# Patient Record
Sex: Female | Born: 1935 | Race: Black or African American | Hispanic: No | Smoking: Never smoker
Health system: Southern US, Community
[De-identification: ages and names within clinical notes are randomized; demographics above are authoritative.]

## PROBLEM LIST (undated history)

## (undated) DIAGNOSIS — I10 Essential (primary) hypertension: Secondary | ICD-10-CM

## (undated) DIAGNOSIS — E119 Type 2 diabetes mellitus without complications: Secondary | ICD-10-CM

---

## 2013-08-04 ENCOUNTER — Encounter (HOSPITAL_COMMUNITY): Payer: Self-pay | Admitting: Emergency Medicine

## 2013-08-04 ENCOUNTER — Emergency Department (HOSPITAL_COMMUNITY): Payer: Medicare (Managed Care)

## 2013-08-04 ENCOUNTER — Inpatient Hospital Stay (HOSPITAL_COMMUNITY)
Admission: EM | Admit: 2013-08-04 | Discharge: 2013-08-15 | DRG: 336 | Disposition: A | Discharge status: Home / Self Care | Payer: Medicare (Managed Care) | Attending: Surgery | Admitting: Surgery

## 2013-08-04 DIAGNOSIS — R32 Unspecified urinary incontinence: Secondary | ICD-10-CM | POA: Diagnosis not present

## 2013-08-04 DIAGNOSIS — K560 Paralytic ileus: Secondary | ICD-10-CM | POA: Diagnosis not present

## 2013-08-04 DIAGNOSIS — Y9210 Unspecified residential institution as the place of occurrence of the external cause: Secondary | ICD-10-CM | POA: Diagnosis not present

## 2013-08-04 DIAGNOSIS — K565 Intestinal adhesions [bands], unspecified as to partial versus complete obstruction: Principal | ICD-10-CM | POA: Diagnosis present

## 2013-08-04 DIAGNOSIS — K929 Disease of digestive system, unspecified: Secondary | ICD-10-CM | POA: Diagnosis not present

## 2013-08-04 DIAGNOSIS — R229 Localized swelling, mass and lump, unspecified: Secondary | ICD-10-CM

## 2013-08-04 DIAGNOSIS — Z9889 Other specified postprocedural states: Secondary | ICD-10-CM

## 2013-08-04 DIAGNOSIS — IMO0002 Reserved for concepts with insufficient information to code with codable children: Secondary | ICD-10-CM

## 2013-08-04 DIAGNOSIS — Y838 Other surgical procedures as the cause of abnormal reaction of the patient, or of later complication, without mention of misadventure at the time of the procedure: Secondary | ICD-10-CM | POA: Diagnosis not present

## 2013-08-04 DIAGNOSIS — Z79899 Other long term (current) drug therapy: Secondary | ICD-10-CM

## 2013-08-04 DIAGNOSIS — E119 Type 2 diabetes mellitus without complications: Secondary | ICD-10-CM | POA: Diagnosis present

## 2013-08-04 DIAGNOSIS — R112 Nausea with vomiting, unspecified: Secondary | ICD-10-CM | POA: Diagnosis not present

## 2013-08-04 DIAGNOSIS — I10 Essential (primary) hypertension: Secondary | ICD-10-CM | POA: Diagnosis present

## 2013-08-04 DIAGNOSIS — M6289 Other specified disorders of muscle: Secondary | ICD-10-CM | POA: Diagnosis present

## 2013-08-04 DIAGNOSIS — K56609 Unspecified intestinal obstruction, unspecified as to partial versus complete obstruction: Secondary | ICD-10-CM

## 2013-08-04 DIAGNOSIS — K8020 Calculus of gallbladder without cholecystitis without obstruction: Secondary | ICD-10-CM

## 2013-08-04 HISTORY — DX: Essential (primary) hypertension: I10

## 2013-08-04 HISTORY — DX: Type 2 diabetes mellitus without complications: E11.9

## 2013-08-04 LAB — COMPREHENSIVE METABOLIC PANEL
ALT: 64 U/L — ABNORMAL HIGH (ref 0–35)
AST: 80 U/L — ABNORMAL HIGH (ref 0–37)
Albumin: 3.9 g/dL (ref 3.5–5.2)
Alkaline Phosphatase: 76 U/L (ref 39–117)
BUN: 26 mg/dL — ABNORMAL HIGH (ref 6–23)
CO2: 26 mEq/L (ref 19–32)
Calcium: 10.6 mg/dL — ABNORMAL HIGH (ref 8.4–10.5)
Chloride: 94 mEq/L — ABNORMAL LOW (ref 96–112)
Creatinine, Ser: 0.78 mg/dL (ref 0.50–1.10)
GFR calc Af Amer: >90 mL/min (ref >90)
GFR calc non Af Amer: 79 mL/min — ABNORMAL LOW (ref >90)
Glucose, Bld: 168 mg/dL — ABNORMAL HIGH (ref 70–99)
Potassium: 4 mEq/L (ref 3.7–5.3)
Sodium: 136 mEq/L — ABNORMAL LOW (ref 137–147)
Total Bilirubin: 1.2 mg/dL (ref 0.3–1.2)
Total Protein: 8 g/dL (ref 6.0–8.3)

## 2013-08-04 LAB — CBC WITH DIFFERENTIAL/PLATELET
Basophils Absolute: 0 10*3/uL (ref 0.0–0.1)
Basophils Relative: 0 % (ref 0–1)
Eosinophils Absolute: 0 10*3/uL (ref 0.0–0.7)
Eosinophils Relative: 0 % (ref 0–5)
HCT: 47.2 % — ABNORMAL HIGH (ref 36.0–46.0)
Hemoglobin: 16.1 g/dL — ABNORMAL HIGH (ref 12.0–15.0)
Lymphocytes Relative: 9 % — ABNORMAL LOW (ref 12–46)
Lymphs Abs: 0.7 10*3/uL (ref 0.7–4.0)
MCH: 30.4 pg (ref 26.0–34.0)
MCHC: 34.1 g/dL (ref 30.0–36.0)
MCV: 89.1 fL (ref 78.0–100.0)
Monocytes Absolute: 0.9 10*3/uL (ref 0.1–1.0)
Monocytes Relative: 11 % (ref 3–12)
Neutro Abs: 6.3 10*3/uL (ref 1.7–7.7)
Neutrophils Relative %: 80 % — ABNORMAL HIGH (ref 43–77)
Platelets: 153 10*3/uL (ref 150–400)
RBC: 5.3 MIL/uL — ABNORMAL HIGH (ref 3.87–5.11)
RDW: 13.4 % (ref 11.5–15.5)
WBC: 7.9 10*3/uL (ref 4.0–10.5)

## 2013-08-04 LAB — URINALYSIS, ROUTINE W REFLEX MICROSCOPIC
Bilirubin Urine: NEGATIVE
Glucose, UA: NEGATIVE mg/dL
Hgb urine dipstick: NEGATIVE
Ketones, ur: NEGATIVE mg/dL
Nitrite: NEGATIVE
Protein, ur: NEGATIVE mg/dL
Specific Gravity, Urine: >1.046 — ABNORMAL HIGH (ref 1.005–1.030)
Urobilinogen, UA: 0.2 mg/dL (ref 0.0–1.0)
pH: 5.5 (ref 5.0–8.0)

## 2013-08-04 LAB — LIPASE, BLOOD: Lipase: 16 U/L (ref 11–59)

## 2013-08-04 LAB — URINE MICROSCOPIC-ADD ON

## 2013-08-04 LAB — MAGNESIUM: Magnesium: 2 mg/dL (ref 1.5–2.5)

## 2013-08-04 LAB — GLUCOSE, CAPILLARY
GLUCOSE-CAPILLARY: 138 mg/dL — AB (ref 70–99)
Glucose-Capillary: 132 mg/dL — ABNORMAL HIGH (ref 70–99)

## 2013-08-04 LAB — PHOSPHORUS: PHOSPHORUS: 4.6 mg/dL (ref 2.3–4.6)

## 2013-08-04 MED ORDER — DORZOLAMIDE HCL-TIMOLOL MAL 2-0.5 % OP SOLN
1.0000 [drp] | Freq: Two times a day (BID) | OPHTHALMIC | Status: DC
  Administered 2013-08-04 – 2013-08-08 (×9): 1 [drp] via OPHTHALMIC
  Filled 2013-08-04 (×2): qty 10

## 2013-08-04 MED ORDER — MORPHINE SULFATE 2 MG/ML IJ SOLN
1.0000 mg | INTRAMUSCULAR | Status: DC | PRN
  Administered 2013-08-04 – 2013-08-08 (×3): 1 mg via INTRAVENOUS
  Filled 2013-08-04 (×3): qty 1

## 2013-08-04 MED ORDER — INSULIN ASPART 100 UNIT/ML ~~LOC~~ SOLN
0.0000 [IU] | Freq: Three times a day (TID) | SUBCUTANEOUS | Status: DC
  Administered 2013-08-04 – 2013-08-06 (×7): 1 [IU] via SUBCUTANEOUS
  Administered 2013-08-07 (×2): 2 [IU] via SUBCUTANEOUS
  Administered 2013-08-07 – 2013-08-08 (×2): 1 [IU] via SUBCUTANEOUS

## 2013-08-04 MED ORDER — ONDANSETRON HCL 4 MG/2ML IJ SOLN
4.0000 mg | Freq: Four times a day (QID) | INTRAMUSCULAR | Status: DC | PRN
  Administered 2013-08-05: 4 mg via INTRAVENOUS
  Filled 2013-08-04: qty 2

## 2013-08-04 MED ORDER — DEXTROSE-NACL 5-0.9 % IV SOLN
INTRAVENOUS | Status: DC
  Administered 2013-08-04 – 2013-08-05 (×2): via INTRAVENOUS

## 2013-08-04 MED ORDER — SODIUM CHLORIDE 0.9 % IV SOLN
INTRAVENOUS | Status: DC
  Administered 2013-08-04: 14:00:00 via INTRAVENOUS

## 2013-08-04 MED ORDER — SODIUM CHLORIDE 0.9 % IV BOLUS (SEPSIS)
1000.0000 mL | Freq: Once | INTRAVENOUS | Status: AC
  Administered 2013-08-04: 1000 mL via INTRAVENOUS

## 2013-08-04 MED ORDER — MORPHINE SULFATE 4 MG/ML IJ SOLN
4.0000 mg | Freq: Once | INTRAMUSCULAR | Status: AC
  Administered 2013-08-04: 4 mg via INTRAVENOUS
  Filled 2013-08-04: qty 1

## 2013-08-04 MED ORDER — MENTHOL 3 MG MT LOZG
1.0000 | LOZENGE | OROMUCOSAL | Status: DC | PRN
  Filled 2013-08-04: qty 9

## 2013-08-04 MED ORDER — SODIUM CHLORIDE 0.9 % IV SOLN
INTRAVENOUS | Status: DC
Start: 2013-08-04 — End: 2013-08-04

## 2013-08-04 MED ORDER — IOHEXOL 300 MG/ML  SOLN
100.0000 mL | Freq: Once | INTRAMUSCULAR | Status: AC | PRN
  Administered 2013-08-04: 100 mL via INTRAVENOUS

## 2013-08-04 MED ORDER — IOHEXOL 300 MG/ML  SOLN
50.0000 mL | Freq: Once | INTRAMUSCULAR | Status: AC | PRN
  Administered 2013-08-04: 50 mL via ORAL

## 2013-08-04 MED ORDER — ONDANSETRON HCL 4 MG/2ML IJ SOLN
4.0000 mg | Freq: Once | INTRAMUSCULAR | Status: AC
  Administered 2013-08-04: 4 mg via INTRAVENOUS
  Filled 2013-08-04: qty 2

## 2013-08-04 MED ORDER — LABETALOL HCL 5 MG/ML IV SOLN
5.0000 mg | Freq: Four times a day (QID) | INTRAVENOUS | Status: DC | PRN
  Administered 2013-08-04 – 2013-08-05 (×2): 5 mg via INTRAVENOUS
  Filled 2013-08-04 (×2): qty 4

## 2013-08-04 MED ORDER — HEPARIN SODIUM (PORCINE) 5000 UNIT/ML IJ SOLN
5000.0000 [IU] | Freq: Three times a day (TID) | INTRAMUSCULAR | Status: DC
Start: 2013-08-04 — End: 2013-08-08
  Administered 2013-08-04 – 2013-08-08 (×12): 5000 [IU] via SUBCUTANEOUS
  Filled 2013-08-04 (×15): qty 1

## 2013-08-04 MED ORDER — ONDANSETRON HCL 4 MG PO TABS: 4.0000 mg | ORAL_TABLET | Freq: Four times a day (QID) | ORAL | Status: DC | PRN

## 2013-08-04 MED ORDER — PHENOL 1.4 % MT LIQD
1.0000 | OROMUCOSAL | Status: DC | PRN
  Filled 2013-08-04: qty 177

## 2013-08-04 NOTE — H&P (Signed)
[facility] History and Physical  Bonnie Briggs OZH:[id number] DOB: 01/14/1935 DOA: 08/04/2013  Referring physician: Dr. Juleen China PCP: No primary provider on file.   Chief Complaint: Nausea, emesis, abdominal discomfort  HPI: Bonnie Briggs is a 79 y.o. female with history of diabetes mellitus and hypertension. Patient reports the above complaints for the last 3 days. States that the emesis has been green at times he has been unable to keep any food down. This has also been accompanied with abdominal discomfort. The symptoms since onset 3 days ago after an persistently present. Nothing she is aware of makes it better. Do to the persistence in symptoms patient decided to present for further evaluation and recommendations.  In the emergency department patient had a CT scan which reported small bowel obstruction most likely mid to distal jejunal. Reports the etiology of obstruction is not identified. Patient reports that he had cesarean section delivery many years ago with a vertical incision.  Review of Systems:  Constitutional:  No weight loss, night sweats, Fevers, chills, fatigue.  HEENT:  No headaches, Difficulty swallowing,Tooth/dental problems,Sore throat,  No sneezing, itching, ear ache, nasal congestion, post nasal drip,  Cardio-vascular:  No chest pain, Orthopnea, PND, swelling in lower extremities, anasarca, dizziness, palpitations  GI:  No heartburn, indigestion, + abdominal pain,+ nausea,+ vomiting, diarrhea, change in bowel habits,+ loss of appetite  Resp:  No shortness of breath with exertion or at rest. No excess mucus, no productive cough, No non-productive cough, No coughing up of blood.No change in color of mucus.No wheezing.No chest wall deformity  Skin:  no rash or lesions.  GU:  no dysuria, change in color of urine, no urgency or frequency. No flank pain.  Musculoskeletal:  No joint pain or swelling. No decreased range of motion. No back pain.  Psych:  No change in  mood or affect. No depression or anxiety. No memory loss.   Past Medical History  Diagnosis Date  . Hypertension   . Diabetes mellitus without complication    History reviewed. No pertinent past surgical history. Social History:  reports that she has never smoked. She does not have any smokeless tobacco history on file. She reports that she does not drink alcohol. Her drug history is not on file.  No Known Allergies  No family history on file.   Prior to Admission medications   Medication Sig Start Date End Date Taking? Authorizing Provider  alum & mag hydroxide-simeth (MAALOX/MYLANTA) 200-200-20 MG/5ML suspension Take 30 mLs by mouth every 6 (six) hours as needed for indigestion or heartburn.   Yes Historical Provider, MD  diltiazem (CARDIZEM) 120 MG tablet Take 120 mg by mouth 2 (two) times daily.   Yes Historical Provider, MD  dorzolamide-timolol (COSOPT) 22.3-6.8 MG/ML ophthalmic solution Place 1 drop into both eyes 2 (two) times daily.    Yes Historical Provider, MD  metFORMIN (GLUMETZA) 500 MG (MOD) 24 hr tablet Take 500 mg by mouth every evening.   Yes Historical Provider, MD  promethazine (PHENERGAN) 12.5 MG tablet Take 12.5-25 mg by mouth every 6 (six) hours as needed for nausea or vomiting.   Yes Historical Provider, MD  valsartan (DIOVAN) 160 MG tablet Take 160 mg by mouth 2 (two) times daily.   Yes Historical Provider, MD   Physical Exam: Filed Vitals:   08/04/13 1235  BP: 167/96  Pulse: 99  Temp:   Resp: 16    BP 167/96  Pulse 99  Temp(Src) 97.8 F (36.6 C) (Oral)  Resp 16  SpO2 93%  General:  Appears calm and comfortable Eyes: PERRL, normal lids, irises & conjunctiva ENT: grossly normal hearing, lips & tongue Neck: no LAD, masses or thyromegaly Cardiovascular: RRR, no m/r/g. No LE edema. Telemetry: SR, no arrhythmias  Respiratory: CTA bilaterally, no w/r/r. Normal respiratory effort. Abdomen: soft, distended, hypoactive bowel sounds, no cullens sign, no  rebound tenderness Skin: no rash or induration seen on limited exam Musculoskeletal: grossly normal tone BUE/BLE Psychiatric: grossly normal mood and affect, speech fluent and appropriate Neurologic: grossly non-focal.          Labs on Admission:  Basic Metabolic Panel:  Recent Labs Lab 08/04/13 0853  NA 136*  K 4.0  CL 94*  CO2 26  GLUCOSE 168*  BUN 26*  CREATININE 0.78  CALCIUM 10.6*   Liver Function Tests:  Recent Labs Lab 08/04/13 0853  AST 80*  ALT 64*  ALKPHOS 76  BILITOT 1.2  PROT 8.0  ALBUMIN 3.9    Recent Labs Lab 08/04/13 0853  LIPASE 16   No results found for this basename: AMMONIA,  in the last 168 hours CBC:  Recent Labs Lab 08/04/13 0853  WBC 7.9  NEUTROABS 6.3  HGB 16.1*  HCT 47.2*  MCV 89.1  PLT 153   Cardiac Enzymes: No results found for this basename: CKTOTAL, CKMB, CKMBINDEX, TROPONINI,  in the last 168 hours  BNP (last 3 results) No results found for this basename: PROBNP,  in the last 8760 hours CBG: No results found for this basename: GLUCAP,  in the last 168 hours  Radiological Exams on Admission: Ct Abdomen Pelvis W Contrast  08/04/2013   CLINICAL DATA:  Pain.  Emesis.  EXAM: CT ABDOMEN AND PELVIS WITH CONTRAST  TECHNIQUE: Multidetector CT imaging of the abdomen and pelvis was performed using the standard protocol following bolus administration of intravenous contrast.  CONTRAST:  50mL OMNIPAQUE IOHEXOL 300 MG/ML SOLN, 100mL OMNIPAQUE IOHEXOL 300 MG/ML SOLN  COMPARISON:  None.  FINDINGS: Liver normal. Hepatic veins and portal vein patent. The spleen and splenic vein are normal. Pancreas normal. No biliary distention. The gallbladder is slightly distended. Multiple gallstones are present. No pericholecystic fluid collection or gallbladder wall thickening noted.  Adrenals normal. Tiny simple cysts and parapelvic cysts both kidneys. No hydronephrosis or obstructing calcified ureteral stones noted. Pelvic phleboliths. Bladder  nondistended. No hydronephrosis. Calcified uterine fibroid. Adnexa unremarkable. Small amount of free pelvic fluid.  No significant adenopathy. Abdominal aorta is widely patent with atherosclerotic vascular changes. Visceral vessels are patent.  The appendix normal. Multiple dilated loops of small bowel are noted with distention up to 3.9 cm noted. These findings are consistent with small-bowel obstruction. This appears to be in the mid to distal jejunal region. Etiology of small bowel obstruction not identified. Decompressed loops of distal small bowel noted. Large amount stool is noted within the colon. The colon is nondistended. No free air. No pneumatosis. Again mesenteric vessels are patent. Mesenteric venous structures are patent. No mesenteric mass. No hernia.  Large fat containing lesion noted in the left gluteal musculature. This measures 9.9 x 5.6 cm. Septations are noted within this fat containing density. A small 1.1 cm nodule in the lower aspect of this fatty mass is noted. Although this finding may represent a benign lipoma a more aggressive lesion such as a liposarcoma cannot be excluded. Nonemergent MRI of the pelvis with gadolinium enhancement is suggested. Heart size normal. Moderate right sided pleural effusion noted. Bibasilar atelectasis and/or infiltrates present. Degenerative changes lumbar spine and both hips.  Tiny sclerotic density in the right hip, most likely a benign bone island.  IMPRESSION: 1. Small bowel obstruction, most likely mid to distal jejunal. Etiology of obstruction is not identified. No pneumatosis, free air, or portal venous air. Mesenteric vasculature patent. 2. Multiple gallstones. Gallbladder slightly distended. No gallbladder wall thickening or pericholecystic fluid collections. 3. 9.9 x 5.6 cm fatty mass within the left gluteal musculature. This mass contains septations and a small 1.1 cm nodule in its inferior portion. Although this may represent a benign lipoma, a  more aggressive lesion such as a liposarcoma cannot be excluded. Nonemergent gadolinium-enhanced MRI of the pelvis is suggested for further evaluation. 4. Small amount of free pelvic fluid. 5. Multiple calcified uterine fibroids. 6. Bibasilar atelectasis and/or infiltrates with moderate size right pleural effusion. These results were called by telephone at the time of interpretation on 08/04/2013 at 10:33 AM to Dr. Raeford RazorSTEPHEN KOHUT , who verbally acknowledged these results.   Electronically Signed   By: Maisie Fushomas  Register   On: 08/04/2013 10:35    Assessment/Plan Active Problems:   SBO (small bowel obstruction) - General surgery consulted by emergency department physician. - Defer NG tube recommendations to Gen. Surgery. - Pain control and bowel rest - Will place on maintenance IV fluids    HTN (hypertension) - Place patient on Labetalol IV PRN elevated BP  DM - Patient will be n.p.o. as such will place on sliding scale insulin -CBG to be checked every 4 hours while patient n.p.o.  DVT prophylaxis: Heparin subcutaneous   Code Status: Full Family Communication: No family at bedside Disposition Plan:   Time spent: > 35 minutes  Penny PiaVEGA, Deiona Hooper [facility] Pager [phone]  **Disclaimer: This note may have been dictated with voice recognition software. Similar sounding words can inadvertently be transcribed and this note may contain transcription errors which may not have been corrected upon publication of note.**

## 2013-08-04 NOTE — Consult Note (Signed)
Reason for Consult:abdominal pain Referring Physician: Dr. Roderick Pee is an 79 y.o. female.  HPI: The pt is a 79yo bf who began feeling bad Thursday. She complained of abdominal pain with nausea and vomiting. She denies fevers. It has been a couple days since she has passed flatus. Her only surgery is a c section 40years ago.  Past Medical History  Diagnosis Date  . Hypertension   . Diabetes mellitus without complication     History reviewed. No pertinent past surgical history.  No family history on file.  Social History:  reports that she has never smoked. She does not have any smokeless tobacco history on file. She reports that she does not drink alcohol. Her drug history is not on file.  Allergies: No Known Allergies  Medications: I have reviewed the patient's current medications.  Results for orders placed during the hospital encounter of 08/04/13 (from the past 48 hour(s))  COMPREHENSIVE METABOLIC PANEL     Status: Abnormal   Collection Time    08/04/13  8:53 AM      Result Value Ref Range   Sodium 136 (*) 137 - 147 mEq/L   Potassium 4.0  3.7 - 5.3 mEq/L   Chloride 94 (*) 96 - 112 mEq/L   CO2 26  19 - 32 mEq/L   Glucose, Bld 168 (*) 70 - 99 mg/dL   BUN 26 (*) 6 - 23 mg/dL   Creatinine, Ser 0.78  0.50 - 1.10 mg/dL   Calcium 10.6 (*) 8.4 - 10.5 mg/dL   Total Protein 8.0  6.0 - 8.3 g/dL   Albumin 3.9  3.5 - 5.2 g/dL   AST 80 (*) 0 - 37 U/L   ALT 64 (*) 0 - 35 U/L   Alkaline Phosphatase 76  39 - 117 U/L   Total Bilirubin 1.2  0.3 - 1.2 mg/dL   GFR calc non Af Amer 79 (*) >90 mL/min   GFR calc Af Amer >90  >90 mL/min   Comment: (NOTE)     The eGFR has been calculated using the CKD EPI equation.     This calculation has not been validated in all clinical situations.     eGFR's persistently <90 mL/min signify possible Chronic Kidney     Disease.  CBC WITH DIFFERENTIAL     Status: Abnormal   Collection Time    08/04/13  8:53 AM      Result Value Ref Range    WBC 7.9  4.0 - 10.5 K/uL   RBC 5.30 (*) 3.87 - 5.11 MIL/uL   Hemoglobin 16.1 (*) 12.0 - 15.0 g/dL   HCT 47.2 (*) 36.0 - 46.0 %   MCV 89.1  78.0 - 100.0 fL   MCH 30.4  26.0 - 34.0 pg   MCHC 34.1  30.0 - 36.0 g/dL   RDW 13.4  11.5 - 15.5 %   Platelets 153  150 - 400 K/uL   Neutrophils Relative % 80 (*) 43 - 77 %   Neutro Abs 6.3  1.7 - 7.7 K/uL   Lymphocytes Relative 9 (*) 12 - 46 %   Lymphs Abs 0.7  0.7 - 4.0 K/uL   Monocytes Relative 11  3 - 12 %   Monocytes Absolute 0.9  0.1 - 1.0 K/uL   Eosinophils Relative 0  0 - 5 %   Eosinophils Absolute 0.0  0.0 - 0.7 K/uL   Basophils Relative 0  0 - 1 %   Basophils Absolute 0.0  0.0 - 0.1 K/uL  LIPASE, BLOOD     Status: None   Collection Time    08/04/13  8:53 AM      Result Value Ref Range   Lipase 16  11 - 59 U/L    Ct Abdomen Pelvis W Contrast  08/04/2013   CLINICAL DATA:  Pain.  Emesis.  EXAM: CT ABDOMEN AND PELVIS WITH CONTRAST  TECHNIQUE: Multidetector CT imaging of the abdomen and pelvis was performed using the standard protocol following bolus administration of intravenous contrast.  CONTRAST:  49m OMNIPAQUE IOHEXOL 300 MG/ML SOLN, 1051mOMNIPAQUE IOHEXOL 300 MG/ML SOLN  COMPARISON:  None.  FINDINGS: Liver normal. Hepatic veins and portal vein patent. The spleen and splenic vein are normal. Pancreas normal. No biliary distention. The gallbladder is slightly distended. Multiple gallstones are present. No pericholecystic fluid collection or gallbladder wall thickening noted.  Adrenals normal. Tiny simple cysts and parapelvic cysts both kidneys. No hydronephrosis or obstructing calcified ureteral stones noted. Pelvic phleboliths. Bladder nondistended. No hydronephrosis. Calcified uterine fibroid. Adnexa unremarkable. Small amount of free pelvic fluid.  No significant adenopathy. Abdominal aorta is widely patent with atherosclerotic vascular changes. Visceral vessels are patent.  The appendix normal. Multiple dilated loops of small bowel are  noted with distention up to 3.9 cm noted. These findings are consistent with small-bowel obstruction. This appears to be in the mid to distal jejunal region. Etiology of small bowel obstruction not identified. Decompressed loops of distal small bowel noted. Large amount stool is noted within the colon. The colon is nondistended. No free air. No pneumatosis. Again mesenteric vessels are patent. Mesenteric venous structures are patent. No mesenteric mass. No hernia.  Large fat containing lesion noted in the left gluteal musculature. This measures 9.9 x 5.6 cm. Septations are noted within this fat containing density. A small 1.1 cm nodule in the lower aspect of this fatty mass is noted. Although this finding may represent a benign lipoma a more aggressive lesion such as a liposarcoma cannot be excluded. Nonemergent MRI of the pelvis with gadolinium enhancement is suggested. Heart size normal. Moderate right sided pleural effusion noted. Bibasilar atelectasis and/or infiltrates present. Degenerative changes lumbar spine and both hips. Tiny sclerotic density in the right hip, most likely a benign bone island.  IMPRESSION: 1. Small bowel obstruction, most likely mid to distal jejunal. Etiology of obstruction is not identified. No pneumatosis, free air, or portal venous air. Mesenteric vasculature patent. 2. Multiple gallstones. Gallbladder slightly distended. No gallbladder wall thickening or pericholecystic fluid collections. 3. 9.9 x 5.6 cm fatty mass within the left gluteal musculature. This mass contains septations and a small 1.1 cm nodule in its inferior portion. Although this may represent a benign lipoma, a more aggressive lesion such as a liposarcoma cannot be excluded. Nonemergent gadolinium-enhanced MRI of the pelvis is suggested for further evaluation. 4. Small amount of free pelvic fluid. 5. Multiple calcified uterine fibroids. 6. Bibasilar atelectasis and/or infiltrates with moderate size right pleural  effusion. These results were called by telephone at the time of interpretation on 08/04/2013 at 10:33 AM to Dr. STVirgel Manifold who verbally acknowledged these results.   Electronically Signed   By: ThMarcello MooresRegister   On: 08/04/2013 10:35    Review of Systems  Constitutional: Negative.   HENT: Negative.   Eyes: Negative.   Respiratory: Negative.   Cardiovascular: Negative.   Gastrointestinal: Positive for nausea, vomiting and abdominal pain.  Genitourinary: Negative.   Musculoskeletal: Negative.   Skin: Negative.  Neurological: Negative.   Endo/Heme/Allergies: Negative.   Psychiatric/Behavioral: Negative.    Blood pressure 144/88, pulse 91, temperature 97.8 F (36.6 C), temperature source Oral, resp. rate 18, SpO2 96.00%. Physical Exam  Constitutional: She is oriented to person, place, and time. She appears well-developed and well-nourished.  HENT:  Head: Normocephalic and atraumatic.  Eyes: Conjunctivae and EOM are normal. Pupils are equal, round, and reactive to light.  Neck: Normal range of motion. Neck supple.  Cardiovascular: Normal rate, regular rhythm and normal heart sounds.   Respiratory: Effort normal and breath sounds normal.  GI: Soft. Bowel sounds are normal.  Mild tenderness but no guarding or peritonitis. quiet  Musculoskeletal: Normal range of motion.  Neurological: She is alert and oriented to person, place, and time.  Skin: Skin is warm and dry.  Psychiatric: She has a normal mood and affect. Her behavior is normal.    Assessment/Plan: The pt appears to have an sbo. I would agree with ng and bowel rest. Would repeat xrays tomorrow. If she does not improve over the next 24-48 hours then we may need to explore her. Will follow closely  TOTH III,Miron Marxen S 08/04/2013, 11:24 AM

## 2013-08-04 NOTE — ED Notes (Signed)
Pt having abd burning and vomiting since Thursday. Pt also states that her urine is darker yellow and burns. Pt denies diarrhea or constipation. Pt went to Urgent care yesterday and given phenergan but not helping with the vomiting. Pt unable to keep fluids or soups/broths down.

## 2013-08-04 NOTE — ED Provider Notes (Addendum)
CSN: [id number]     Arrival date & time 08/04/13  [id number] History   First MD Initiated Contact with Patient 08/04/13 0754     Chief Complaint  Patient presents with  . Abdominal Pain  . Emesis     (Consider location/radiation/quality/duration/timing/severity/associated sxs/prior Treatment) HPI  79 year old female with abdominal pain. Gradual onset 2 days ago. Pain is in the upper abdomen. Describes a burning sensation. Associated with multiple episodes of vomiting. Seen at Urgent Care yesterday and was prescribed Phenergan. She's been taking this without any relief. No fever or chills. No urinary complaints. Surgical history significant for cesarean section decades ago. Feels bloated. Last BM 3 days ago. No sick contacts.  Past Medical History  Diagnosis Date  . Hypertension   . Diabetes mellitus without complication    History reviewed. No pertinent past surgical history. No family history on file. History  Substance Use Topics  . Smoking status: Never Smoker   . Smokeless tobacco: Not on file  . Alcohol Use: No   OB History   Grav Para Term Preterm Abortions TAB SAB Ect Mult Living                 Review of Systems  All systems reviewed and negative, other than as noted in HPI.   Allergies  Review of patient's allergies indicates no known allergies.  Home Medications   Prior to Admission medications   Medication Sig Start Date End Date Taking? Authorizing Provider  alum & mag hydroxide-simeth (MAALOX/MYLANTA) 200-200-20 MG/5ML suspension Take 30 mLs by mouth every 6 (six) hours as needed for indigestion or heartburn.   Yes Historical Provider, MD  diltiazem (CARDIZEM) 120 MG tablet Take 120 mg by mouth 2 (two) times daily.   Yes Historical Provider, MD  dorzolamide-timolol (COSOPT) 22.3-6.8 MG/ML ophthalmic solution Place 1 drop into both eyes 2 (two) times daily.    Yes Historical Provider, MD  metFORMIN (GLUMETZA) 500 MG (MOD) 24 hr tablet Take 500 mg by mouth every  evening.   Yes Historical Provider, MD  promethazine (PHENERGAN) 12.5 MG tablet Take 12.5-25 mg by mouth every 6 (six) hours as needed for nausea or vomiting.   Yes Historical Provider, MD  valsartan (DIOVAN) 160 MG tablet Take 160 mg by mouth 2 (two) times daily.   Yes Historical Provider, MD   BP 144/88  Pulse 91  Temp(Src) 97.8 F (36.6 C) (Oral)  Resp 18  SpO2 96% Physical Exam  Nursing note and vitals reviewed. Constitutional: She appears well-developed and well-nourished. No distress.  HENT:  Head: Normocephalic and atraumatic.  Eyes: Conjunctivae are normal. Right eye exhibits no discharge. Left eye exhibits no discharge.  Neck: Neck supple.  Cardiovascular: Normal rate, regular rhythm and normal heart sounds.  Exam reveals no gallop and no friction rub.   No murmur heard. Pulmonary/Chest: Effort normal and breath sounds normal. No respiratory distress.  Abdominal: Soft. She exhibits no distension. There is tenderness.  Mild upper nominal tenderness. Doesn't particularly lateralize. No rebound or guarding. No distention. No mass palpated.  Genitourinary:  No costovertebral angle tenderness  Musculoskeletal: She exhibits no edema and no tenderness.  Neurological: She is alert.  Skin: Skin is warm and dry.  Psychiatric: She has a normal mood and affect. Her behavior is normal. Thought content normal.    ED Course  Procedures (including critical care time) Labs Review Labs Reviewed  COMPREHENSIVE METABOLIC PANEL - Abnormal; Notable for the following:    Sodium 136 (*)  Chloride 94 (*)    Glucose, Bld 168 (*)    BUN 26 (*)    Calcium 10.6 (*)    AST 80 (*)    ALT 64 (*)    GFR calc non Af Amer 79 (*)    All other components within normal limits  CBC WITH DIFFERENTIAL - Abnormal; Notable for the following:    RBC 5.30 (*)    Hemoglobin 16.1 (*)    HCT 47.2 (*)    Neutrophils Relative % 80 (*)    Lymphocytes Relative 9 (*)    All other components within normal  limits  LIPASE, BLOOD  URINALYSIS, ROUTINE W REFLEX MICROSCOPIC    Imaging Review Ct Abdomen Pelvis W Contrast  08/04/2013   CLINICAL DATA:  Pain.  Emesis.  EXAM: CT ABDOMEN AND PELVIS WITH CONTRAST  TECHNIQUE: Multidetector CT imaging of the abdomen and pelvis was performed using the standard protocol following bolus administration of intravenous contrast.  CONTRAST:  50mL OMNIPAQUE IOHEXOL 300 MG/ML SOLN, 100mL OMNIPAQUE IOHEXOL 300 MG/ML SOLN  COMPARISON:  None.  FINDINGS: Liver normal. Hepatic veins and portal vein patent. The spleen and splenic vein are normal. Pancreas normal. No biliary distention. The gallbladder is slightly distended. Multiple gallstones are present. No pericholecystic fluid collection or gallbladder wall thickening noted.  Adrenals normal. Tiny simple cysts and parapelvic cysts both kidneys. No hydronephrosis or obstructing calcified ureteral stones noted. Pelvic phleboliths. Bladder nondistended. No hydronephrosis. Calcified uterine fibroid. Adnexa unremarkable. Small amount of free pelvic fluid.  No significant adenopathy. Abdominal aorta is widely patent with atherosclerotic vascular changes. Visceral vessels are patent.  The appendix normal. Multiple dilated loops of small bowel are noted with distention up to 3.9 cm noted. These findings are consistent with small-bowel obstruction. This appears to be in the mid to distal jejunal region. Etiology of small bowel obstruction not identified. Decompressed loops of distal small bowel noted. Large amount stool is noted within the colon. The colon is nondistended. No free air. No pneumatosis. Again mesenteric vessels are patent. Mesenteric venous structures are patent. No mesenteric mass. No hernia.  Large fat containing lesion noted in the left gluteal musculature. This measures 9.9 x 5.6 cm. Septations are noted within this fat containing density. A small 1.1 cm nodule in the lower aspect of this fatty mass is noted. Although this  finding may represent a benign lipoma a more aggressive lesion such as a liposarcoma cannot be excluded. Nonemergent MRI of the pelvis with gadolinium enhancement is suggested. Heart size normal. Moderate right sided pleural effusion noted. Bibasilar atelectasis and/or infiltrates present. Degenerative changes lumbar spine and both hips. Tiny sclerotic density in the right hip, most likely a benign bone island.  IMPRESSION: 1. Small bowel obstruction, most likely mid to distal jejunal. Etiology of obstruction is not identified. No pneumatosis, free air, or portal venous air. Mesenteric vasculature patent. 2. Multiple gallstones. Gallbladder slightly distended. No gallbladder wall thickening or pericholecystic fluid collections. 3. 9.9 x 5.6 cm fatty mass within the left gluteal musculature. This mass contains septations and a small 1.1 cm nodule in its inferior portion. Although this may represent a benign lipoma, a more aggressive lesion such as a liposarcoma cannot be excluded. Nonemergent gadolinium-enhanced MRI of the pelvis is suggested for further evaluation. 4. Small amount of free pelvic fluid. 5. Multiple calcified uterine fibroids. 6. Bibasilar atelectasis and/or infiltrates with moderate size right pleural effusion. These results were called by telephone at the time of interpretation on 08/04/2013 at 10:33  AM to Dr. Raeford Razor , who verbally acknowledged these results.   Electronically Signed   By: Maisie Fus  Register   On: 08/04/2013 10:35     EKG Interpretation None      MDM   Final diagnoses:  SBO (small bowel obstruction)    79 year old female with abdominal pain and nausea/vomiting. Mild tenderness on exam. Not peritoneal. Afebrile. Hemodynamically stable. Will place an IV. Fluids. Pain medicine and antiemetics. Given her age, will CT. Labs. UA.     Raeford Razor, MD 08/04/13 1049   11:21 AM Discussed with Dr Carolynne Edouard, general surgery. Will see in consultation.   Raeford Razor,  MD 08/04/13 1122

## 2013-08-05 ENCOUNTER — Observation Stay (HOSPITAL_COMMUNITY): Payer: Medicare (Managed Care)

## 2013-08-05 DIAGNOSIS — R109 Unspecified abdominal pain: Secondary | ICD-10-CM

## 2013-08-05 DIAGNOSIS — E119 Type 2 diabetes mellitus without complications: Secondary | ICD-10-CM | POA: Diagnosis present

## 2013-08-05 DIAGNOSIS — R229 Localized swelling, mass and lump, unspecified: Secondary | ICD-10-CM

## 2013-08-05 DIAGNOSIS — R112 Nausea with vomiting, unspecified: Secondary | ICD-10-CM | POA: Diagnosis present

## 2013-08-05 DIAGNOSIS — Y9210 Unspecified residential institution as the place of occurrence of the external cause: Secondary | ICD-10-CM | POA: Diagnosis not present

## 2013-08-05 DIAGNOSIS — Z79899 Other long term (current) drug therapy: Secondary | ICD-10-CM | POA: Diagnosis not present

## 2013-08-05 DIAGNOSIS — K929 Disease of digestive system, unspecified: Secondary | ICD-10-CM | POA: Diagnosis not present

## 2013-08-05 DIAGNOSIS — K560 Paralytic ileus: Secondary | ICD-10-CM | POA: Diagnosis not present

## 2013-08-05 DIAGNOSIS — K8020 Calculus of gallbladder without cholecystitis without obstruction: Secondary | ICD-10-CM | POA: Diagnosis present

## 2013-08-05 DIAGNOSIS — Y838 Other surgical procedures as the cause of abnormal reaction of the patient, or of later complication, without mention of misadventure at the time of the procedure: Secondary | ICD-10-CM | POA: Diagnosis not present

## 2013-08-05 DIAGNOSIS — R32 Unspecified urinary incontinence: Secondary | ICD-10-CM | POA: Diagnosis not present

## 2013-08-05 DIAGNOSIS — K565 Intestinal adhesions [bands], unspecified as to partial versus complete obstruction: Secondary | ICD-10-CM | POA: Diagnosis present

## 2013-08-05 DIAGNOSIS — I10 Essential (primary) hypertension: Secondary | ICD-10-CM | POA: Diagnosis present

## 2013-08-05 DIAGNOSIS — M6289 Other specified disorders of muscle: Secondary | ICD-10-CM | POA: Diagnosis present

## 2013-08-05 LAB — CBC
HCT: 44.7 % (ref 36.0–46.0)
HEMOGLOBIN: 14.4 g/dL (ref 12.0–15.0)
MCH: 29.7 pg (ref 26.0–34.0)
MCHC: 32.2 g/dL (ref 30.0–36.0)
MCV: 92.2 fL (ref 78.0–100.0)
PLATELETS: 146 10*3/uL — AB (ref 150–400)
RBC: 4.85 MIL/uL (ref 3.87–5.11)
RDW: 13.8 % (ref 11.5–15.5)
WBC: 5.6 10*3/uL (ref 4.0–10.5)

## 2013-08-05 LAB — BASIC METABOLIC PANEL
BUN: 23 mg/dL (ref 6–23)
CO2: 31 mEq/L (ref 19–32)
Calcium: 9.4 mg/dL (ref 8.4–10.5)
Chloride: 97 mEq/L (ref 96–112)
Creatinine, Ser: 0.77 mg/dL (ref 0.50–1.10)
GFR calc Af Amer: >90 mL/min (ref >90)
GFR calc non Af Amer: 80 mL/min — ABNORMAL LOW (ref >90)
GLUCOSE: 156 mg/dL — AB (ref 70–99)
POTASSIUM: 4.3 meq/L (ref 3.7–5.3)
SODIUM: 140 meq/L (ref 137–147)

## 2013-08-05 LAB — GLUCOSE, CAPILLARY
GLUCOSE-CAPILLARY: 132 mg/dL — AB (ref 70–99)
GLUCOSE-CAPILLARY: 137 mg/dL — AB (ref 70–99)
GLUCOSE-CAPILLARY: 178 mg/dL — AB (ref 70–99)
Glucose-Capillary: 142 mg/dL — ABNORMAL HIGH (ref 70–99)
Glucose-Capillary: 137 mg/dL — ABNORMAL HIGH (ref 70–99)
Glucose-Capillary: 138 mg/dL — ABNORMAL HIGH (ref 70–99)

## 2013-08-05 LAB — LACTIC ACID, PLASMA: LACTIC ACID, VENOUS: 0.9 mmol/L (ref 0.5–2.2)

## 2013-08-05 MED ORDER — KCL IN DEXTROSE-NACL 30-5-0.45 MEQ/L-%-% IV SOLN: INTRAVENOUS | Status: DC

## 2013-08-05 MED ORDER — KCL IN DEXTROSE-NACL 30-5-0.45 MEQ/L-%-% IV SOLN
INTRAVENOUS | Status: DC
  Administered 2013-08-05 – 2013-08-07 (×5): via INTRAVENOUS
  Administered 2013-08-07: 100 mL/h via INTRAVENOUS
  Administered 2013-08-08: 01:00:00 via INTRAVENOUS
  Filled 2013-08-05 (×9): qty 1000

## 2013-08-05 NOTE — Progress Notes (Signed)
[facility] PROGRESS NOTE  Bonnie Briggs RUE:[id number] DOB: 24-Dec-1934 DOA: 08/04/2013 PCP: No primary provider on file.  Assessment/Plan: Active Problems:   SBO (small bowel obstruction) - General surgery on board and managing.    HTN (hypertension) - given npo status holding oral antihypertensive medication - Labetalol 5 mg IV prn elevated blood pressure    Code Status: Full code Family Communication: Discussed with patient and family members at bedside  Disposition Plan: Pending further evaluation and recommendations from general surgeon   Consultants:  General surgery  Procedures:  none  Antibiotics:  none  HPI/Subjective: Pt has no new complaints. No acute issues overnight reported  Objective: Filed Vitals:   08/05/13 1455  BP: 160/98  Pulse: 71  Temp: 98 F (36.7 C)  Resp: 16    Intake/Output Summary (Last 24 hours) at 08/05/13 1524 Last data filed at 08/05/13 1456  Gross per 24 hour  Intake 2567.5 ml  Output    800 ml  Net 1767.5 ml   There were no vitals filed for this visit.  Exam:   General:  Patient in no acute distress, alert and awake  Cardiovascular: Regular rhythm and rate, no murmurs or rubs  Respiratory: Clear to auscultation bilaterally, no wheezes  Abdomen: Hypoactive bowel sounds, distention, soft  Musculoskeletal: No cyanosis or clubbing   Data Reviewed: Basic Metabolic Panel:  Recent Labs Lab 08/04/13 0853 08/05/13 0520  NA 136* 140  K 4.0 4.3  CL 94* 97  CO2 26 31  GLUCOSE 168* 156*  BUN 26* 23  CREATININE 0.78 0.77  CALCIUM 10.6* 9.4  MG 2.0  --   PHOS 4.6  --    Liver Function Tests:  Recent Labs Lab 08/04/13 0853  AST 80*  ALT 64*  ALKPHOS 76  BILITOT 1.2  PROT 8.0  ALBUMIN 3.9    Recent Labs Lab 08/04/13 0853  LIPASE 16   No results found for this basename: AMMONIA,  in the last 168 hours CBC:  Recent Labs Lab 08/04/13 0853 08/05/13 0520  WBC 7.9 5.6  NEUTROABS 6.3  --    HGB 16.1* 14.4  HCT 47.2* 44.7  MCV 89.1 92.2  PLT 153 146*   Cardiac Enzymes: No results found for this basename: CKTOTAL, CKMB, CKMBINDEX, TROPONINI,  in the last 168 hours BNP (last 3 results) No results found for this basename: PROBNP,  in the last 8760 hours CBG:  Recent Labs Lab 08/04/13 2005 08/05/13 0007 08/05/13 0331 08/05/13 0711 08/05/13 1321  GLUCAP 138* 132* 178* 142* 137*    No results found for this or any previous visit (from the past 240 hour(s)).   Studies: Ct Abdomen Pelvis W Contrast  08/04/2013   CLINICAL DATA:  Pain.  Emesis.  EXAM: CT ABDOMEN AND PELVIS WITH CONTRAST  TECHNIQUE: Multidetector CT imaging of the abdomen and pelvis was performed using the standard protocol following bolus administration of intravenous contrast.  CONTRAST:  50mL OMNIPAQUE IOHEXOL 300 MG/ML SOLN, OMNIPAQUE IOHEXOL 300 MG/ML SOLN  COMPARISON:  None.  FINDINGS: Liver normal. Hepatic veins and portal vein patent. The spleen and splenic vein are normal. Pancreas normal. No biliary distention. The gallbladder is slightly distended. Multiple gallstones are present. No pericholecystic fluid collection or gallbladder wall thickening noted.  Adrenals normal. Tiny simple cysts and parapelvic cysts both kidneys. No hydronephrosis or obstructing calcified ureteral stones noted. Pelvic phleboliths. Bladder nondistended. No hydronephrosis. Calcified uterine fibroid. Adnexa unremarkable. Small amount of free pelvic fluid.  No significant adenopathy. Abdominal  aorta is widely patent with atherosclerotic vascular changes. Visceral vessels are patent.  The appendix normal. Multiple dilated loops of small bowel are noted with distention up to 3.9 cm noted. These findings are consistent with small-bowel obstruction. This appears to be in the mid to distal jejunal region. Etiology of small bowel obstruction not identified. Decompressed loops of distal small bowel noted. Large amount stool is noted  within the colon. The colon is nondistended. No free air. No pneumatosis. Again mesenteric vessels are patent. Mesenteric venous structures are patent. No mesenteric mass. No hernia.  Large fat containing lesion noted in the left gluteal musculature. This measures 9.9 x 5.6 cm. Septations are noted within this fat containing density. A small 1.1 cm nodule in the lower aspect of this fatty mass is noted. Although this finding may represent a benign lipoma a more aggressive lesion such as a liposarcoma cannot be excluded. Nonemergent MRI of the pelvis with gadolinium enhancement is suggested. Heart size normal. Moderate right sided pleural effusion noted. Bibasilar atelectasis and/or infiltrates present. Degenerative changes lumbar spine and both hips. Tiny sclerotic density in the right hip, most likely a benign bone island.  IMPRESSION: 1. Small bowel obstruction, most likely mid to distal jejunal. Etiology of obstruction is not identified. No pneumatosis, free air, or portal venous air. Mesenteric vasculature patent. 2. Multiple gallstones. Gallbladder slightly distended. No gallbladder wall thickening or pericholecystic fluid collections. 3. 9.9 x 5.6 cm fatty mass within the left gluteal musculature. This mass contains septations and a small 1.1 cm nodule in its inferior portion. Although this may represent a benign lipoma, a more aggressive lesion such as a liposarcoma cannot be excluded. Nonemergent gadolinium-enhanced MRI of the pelvis is suggested for further evaluation. 4. Small amount of free pelvic fluid. 5. Multiple calcified uterine fibroids. 6. Bibasilar atelectasis and/or infiltrates with moderate size right pleural effusion. These results were called by telephone at the time of interpretation on 08/04/2013 at 10:33 AM to Dr. Raeford RazorSTEPHEN KOHUT , who verbally acknowledged these results.   Electronically Signed   By: Maisie Fushomas  Register   On: 08/04/2013 10:35   Dg Abd 2 Views  08/05/2013   CLINICAL DATA:   Abdominal pain and distention. Follow-up bowel obstruction.  EXAM: ABDOMEN - 2 VIEW  COMPARISON:  07/04/2013  FINDINGS: The patient now has a nasogastric tube which is coiled in the stomach region. There continues to be dilated loops of small bowel with air-fluid levels. The degree of small bowel distention has not significantly changed. There continues to be stool in the right colon. Mid small bowel loops measure up to 5.6 cm. Right basilar densities probably represent a combination of pleural fluid and atelectasis. Iodinated contrast in the urinary bladder. No clear evidence for free air.  IMPRESSION: Dilated loops of small bowel with air-fluid levels. The degree of small bowel distention has not decreased and findings are compatible with a high-grade small bowel obstruction.  Nasogastric tube is in the stomach region.  Right basilar chest densities are suggestive for pleural fluid and atelectasis.   Electronically Signed   By: Richarda OverlieAdam  Henn M.D.   On: 08/05/2013 08:33    Scheduled Meds: . dorzolamide-timolol  1 drop Both Eyes BID  . heparin  5,000 Units Subcutaneous 3 times per day  . insulin aspart  0-9 Units Subcutaneous TID WC   Continuous Infusions: . dexrose 5 % and 0.45 % NaCl with KCl 30 mEq/L 100 mL/hr at 08/05/13 1128    Time spent: > 35 minutes  Penny PiaVEGA, Anona Giovannini  [facility] Pager [phone] If 7PM-7AM, please contact night-coverage at [email or web address], password [email or web address] 08/05/2013, 3:24 PM  LOS: 1 day

## 2013-08-05 NOTE — Progress Notes (Signed)
Patient ID: Bonnie Briggs, female   DOB: Aug 23, 1934, 79 y.o.   MRN: [id number]  [facility] Note  Subjective: Patient in bed, family at bedside.  No pain.  Less distended. No BM or flatus.  Objective: Vital signs in last 24 hours: Temp:  [97.8 F (36.6 C)-98.4 F (36.9 C)] 97.8 F (36.6 C) (06/14 0545) Pulse Rate:  [70-99] 70 (06/14 0545) Resp:  [16-20] 16 (06/14 0545) BP: (139-167)/(84-96) 139/84 mmHg (06/14 0545) SpO2:  [92 %-95 %] 92 % (06/14 0545) Last BM Date: 07/31/13  Intake/Output from previous day: 06/13 0701 - 06/14 0700 In: 2567.5 [I.V.:1117.5; NG/GT:1450] Out: 800 [Urine:500; Emesis/NG output:300]  Exam: HEENT - clear, not icteric Neck - soft Chest - clear bilaterally Cor - RRR, no murmur Abd - soft, mild distension; mild upper abd tenderness; quiet; NG with bilious Ext - no significant edema Neuro - grossly intact, no focal deficits  Lab Results:   Recent Labs  08/04/13 0853 08/05/13 0520  WBC 7.9 5.6  HGB 16.1* 14.4  HCT 47.2* 44.7  PLT 153 146*     Recent Labs  08/04/13 0853 08/05/13 0520  NA 136* 140  K 4.0 4.3  CL 94* 97  CO2 26 31  GLUCOSE 168* 156*  BUN 26* 23  CREATININE 0.78 0.77  CALCIUM 10.6* 9.4    Studies/Results: Ct Abdomen Pelvis W Contrast  08/04/2013   CLINICAL DATA:  Pain.  Emesis.  EXAM: CT ABDOMEN AND PELVIS WITH CONTRAST  TECHNIQUE: Multidetector CT imaging of the abdomen and pelvis was performed using the standard protocol following bolus administration of intravenous contrast.  CONTRAST:  50mL OMNIPAQUE IOHEXOL 300 MG/ML SOLN, 100mL OMNIPAQUE IOHEXOL 300 MG/ML SOLN  COMPARISON:  None.  FINDINGS: Liver normal. Hepatic veins and portal vein patent. The spleen and splenic vein are normal. Pancreas normal. No biliary distention. The gallbladder is slightly distended. Multiple gallstones are present. No pericholecystic fluid collection or gallbladder wall thickening noted.   Adrenals normal. Tiny simple cysts and parapelvic cysts both kidneys. No hydronephrosis or obstructing calcified ureteral stones noted. Pelvic phleboliths. Bladder nondistended. No hydronephrosis. Calcified uterine fibroid. Adnexa unremarkable. Small amount of free pelvic fluid.  No significant adenopathy. Abdominal aorta is widely patent with atherosclerotic vascular changes. Visceral vessels are patent.  The appendix normal. Multiple dilated loops of small bowel are noted with distention up to 3.9 cm noted. These findings are consistent with small-bowel obstruction. This appears to be in the mid to distal jejunal region. Etiology of small bowel obstruction not identified. Decompressed loops of distal small bowel noted. Large amount stool is noted within the colon. The colon is nondistended. No free air. No pneumatosis. Again mesenteric vessels are patent. Mesenteric venous structures are patent. No mesenteric mass. No hernia.  Large fat containing lesion noted in the left gluteal musculature. This measures 9.9 x 5.6 cm. Septations are noted within this fat containing density. A small 1.1 cm nodule in the lower aspect of this fatty mass is noted. Although this finding may represent a benign lipoma a more aggressive lesion such as a liposarcoma cannot be excluded. Nonemergent MRI of the pelvis with gadolinium enhancement is suggested. Heart size normal. Moderate right sided pleural effusion noted. Bibasilar atelectasis and/or infiltrates present. Degenerative changes lumbar spine and both hips. Tiny sclerotic density in the right hip, most likely a benign bone island.  IMPRESSION: 1. Small bowel obstruction, most likely mid to distal jejunal. Etiology of obstruction is not identified. No  pneumatosis, free air, or portal venous air. Mesenteric vasculature patent. 2. Multiple gallstones. Gallbladder slightly distended. No gallbladder wall thickening or pericholecystic fluid collections. 3. 9.9 x 5.6 cm fatty mass  within the left gluteal musculature. This mass contains septations and a small 1.1 cm nodule in its inferior portion. Although this may represent a benign lipoma, a more aggressive lesion such as a liposarcoma cannot be excluded. Nonemergent gadolinium-enhanced MRI of the pelvis is suggested for further evaluation. 4. Small amount of free pelvic fluid. 5. Multiple calcified uterine fibroids. 6. Bibasilar atelectasis and/or infiltrates with moderate size right pleural effusion. These results were called by telephone at the time of interpretation on 08/04/2013 at 10:33 AM to Dr. Raeford RazorSTEPHEN KOHUT , who verbally acknowledged these results.   Electronically Signed   By: Maisie Fushomas  Register   On: 08/04/2013 10:35    Assessment / Plan: 1.  Small bowel obstruction, likely secondary to adhesions  Continue NPO, IV hydration - change IVF and rate  NG decompression - allow ice chips  Encouraged ambulation and IS use  AXR in AM 6/15  Labs in AM 6/15 2.  Soft tissue mass left gluteus muscle  Will require further evalution - likely MRI when able 3.  Cholelithiasis  Asymptomatic at present  Velora Hecklerodd M. Malessa Zartman, MD, [facility] Office: [phone]  08/05/2013

## 2013-08-05 NOTE — Progress Notes (Signed)
UR Completed.  Shante Maysonet Jane [phone] 08/05/2013  

## 2013-08-06 ENCOUNTER — Inpatient Hospital Stay (HOSPITAL_COMMUNITY): Payer: Medicare (Managed Care)

## 2013-08-06 LAB — CBC
HEMATOCRIT: 46.1 % — AB (ref 36.0–46.0)
HEMOGLOBIN: 14.8 g/dL (ref 12.0–15.0)
MCH: 30.1 pg (ref 26.0–34.0)
MCHC: 32.1 g/dL (ref 30.0–36.0)
MCV: 93.9 fL (ref 78.0–100.0)
Platelets: 126 10*3/uL — ABNORMAL LOW (ref 150–400)
RBC: 4.91 MIL/uL (ref 3.87–5.11)
RDW: 13.5 % (ref 11.5–15.5)
WBC: 5.7 10*3/uL (ref 4.0–10.5)

## 2013-08-06 LAB — BASIC METABOLIC PANEL
BUN: 17 mg/dL (ref 6–23)
CALCIUM: 9.4 mg/dL (ref 8.4–10.5)
CO2: 31 mEq/L (ref 19–32)
Chloride: 98 mEq/L (ref 96–112)
Creatinine, Ser: 0.76 mg/dL (ref 0.50–1.10)
GFR, EST NON AFRICAN AMERICAN: 80 mL/min — AB (ref >90)
GLUCOSE: 153 mg/dL — AB (ref 70–99)
POTASSIUM: 3.9 meq/L (ref 3.7–5.3)
Sodium: 140 mEq/L (ref 137–147)

## 2013-08-06 LAB — GLUCOSE, CAPILLARY
GLUCOSE-CAPILLARY: 148 mg/dL — AB (ref 70–99)
GLUCOSE-CAPILLARY: 132 mg/dL — AB (ref 70–99)
GLUCOSE-CAPILLARY: 139 mg/dL — AB (ref 70–99)
GLUCOSE-CAPILLARY: 163 mg/dL — AB (ref 70–99)
GLUCOSE-CAPILLARY: 165 mg/dL — AB (ref 70–99)
Glucose-Capillary: 159 mg/dL — ABNORMAL HIGH (ref 70–99)
Glucose-Capillary: 165 mg/dL — ABNORMAL HIGH (ref 70–99)

## 2013-08-06 LAB — LACTIC ACID, PLASMA: Lactic Acid, Venous: 0.9 mmol/L (ref 0.5–2.2)

## 2013-08-06 NOTE — Progress Notes (Signed)
Seen and agree Bonnie Briggs  

## 2013-08-06 NOTE — Progress Notes (Signed)
Patient ID: Bonnie Briggs, female   DOB: 12-22-1934, 79 y.o.   MRN: [id number]  Subjective: Had a large BM last night.  2031m/24h NGT output.  No n/v.  No abdominal pain, "feels sore."    Objective:  Vital signs:  Filed Vitals:   08/05/13 2118 08/05/13 2150 08/06/13 0525 08/06/13 0559  BP: 140/90 144/92 171/118 140/90  Pulse:  90 77   Temp:  98.1 F (36.7 C) 98.4 F (36.9 C)   TempSrc:  Oral Oral   Resp:  18 18   SpO2:  93% 92%     Last BM Date: 08/05/13  Intake/Output   Yesterday:  06/14 0701 - 06/15 0700 In: 1953.3 [I.V.:1953.3] Out: 2050 [Emesis/NG output:2050] This shift: I/O last 3 completed shifts: In: 3653.3 [I.V.:2853.3; NG/GT:800] Out: 2350 [Emesis/NG output:2350]    Physical Exam: General: Pt awake/alert/oriented x4 in no acute distress Abdomen: Soft.  Nondistended. nontender. No evidence of peritonitis.  No incarcerated hernias. Ext:  SCDs BLE.  No mjr edema.  No cyanosis Skin: No petechiae / purpura   Problem List:   Active Problems:   SBO (small bowel obstruction)   HTN (hypertension)   Small bowel obstruction    Results:   Labs: Results for orders placed during the hospital encounter of 08/04/13 (from the past 48 hour(s))  COMPREHENSIVE METABOLIC PANEL     Status: Abnormal   Collection Time    08/04/13  8:53 AM      Result Value Ref Range   Sodium 136 (*) 137 - 147 mEq/L   Potassium 4.0  3.7 - 5.3 mEq/L   Chloride 94 (*) 96 - 112 mEq/L   CO2 26  19 - 32 mEq/L   Glucose, Bld 168 (*) 70 - 99 mg/dL   BUN 26 (*) 6 - 23 mg/dL   Creatinine, Ser 0.78  0.50 - 1.10 mg/dL   Calcium 10.6 (*) 8.4 - 10.5 mg/dL   Total Protein 8.0  6.0 - 8.3 g/dL   Albumin 3.9  3.5 - 5.2 g/dL   AST 80 (*) 0 - 37 U/L   ALT 64 (*) 0 - 35 U/L   Alkaline Phosphatase 76  39 - 117 U/L   Total Bilirubin 1.2  0.3 - 1.2 mg/dL   GFR calc non Af Amer 79 (*) >90 mL/min   GFR calc Af Amer >90  >90 mL/min   Comment: (NOTE)     The eGFR has been calculated using the CKD EPI  equation.     This calculation has not been validated in all clinical situations.     eGFR's persistently <90 mL/min signify possible Chronic Kidney     Disease.  CBC WITH DIFFERENTIAL     Status: Abnormal   Collection Time    08/04/13  8:53 AM      Result Value Ref Range   WBC 7.9  4.0 - 10.5 K/uL   RBC 5.30 (*) 3.87 - 5.11 MIL/uL   Hemoglobin 16.1 (*) 12.0 - 15.0 g/dL   HCT 47.2 (*) 36.0 - 46.0 %   MCV 89.1  78.0 - 100.0 fL   MCH 30.4  26.0 - 34.0 pg   MCHC 34.1  30.0 - 36.0 g/dL   RDW 13.4  11.5 - 15.5 %   Platelets 153  150 - 400 K/uL   Neutrophils Relative % 80 (*) 43 - 77 %   Neutro Abs 6.3  1.7 - 7.7 K/uL   Lymphocytes Relative 9 (*) 12 - 46 %  Lymphs Abs 0.7  0.7 - 4.0 K/uL   Monocytes Relative 11  3 - 12 %   Monocytes Absolute 0.9  0.1 - 1.0 K/uL   Eosinophils Relative 0  0 - 5 %   Eosinophils Absolute 0.0  0.0 - 0.7 K/uL   Basophils Relative 0  0 - 1 %   Basophils Absolute 0.0  0.0 - 0.1 K/uL  LIPASE, BLOOD     Status: None   Collection Time    08/04/13  8:53 AM      Result Value Ref Range   Lipase 16  11 - 59 U/L  MAGNESIUM     Status: None   Collection Time    08/04/13  8:53 AM      Result Value Ref Range   Magnesium 2.0  1.5 - 2.5 mg/dL  PHOSPHORUS     Status: None   Collection Time    08/04/13  8:53 AM      Result Value Ref Range   Phosphorus 4.6  2.3 - 4.6 mg/dL  URINALYSIS, ROUTINE W REFLEX MICROSCOPIC     Status: Abnormal   Collection Time    08/04/13 10:43 AM      Result Value Ref Range   Color, Urine YELLOW  YELLOW   APPearance CLEAR  CLEAR   Specific Gravity, Urine >1.046 (*) 1.005 - 1.030   pH 5.5  5.0 - 8.0   Glucose, UA NEGATIVE  NEGATIVE mg/dL   Hgb urine dipstick NEGATIVE  NEGATIVE   Bilirubin Urine NEGATIVE  NEGATIVE   Ketones, ur NEGATIVE  NEGATIVE mg/dL   Protein, ur NEGATIVE  NEGATIVE mg/dL   Urobilinogen, UA 0.2  0.0 - 1.0 mg/dL   Nitrite NEGATIVE  NEGATIVE   Leukocytes, UA SMALL (*) NEGATIVE  URINE MICROSCOPIC-ADD ON      Status: None   Collection Time    08/04/13 10:43 AM      Result Value Ref Range   Squamous Epithelial / LPF RARE  RARE   WBC, UA 3-6  <3 WBC/hpf   RBC / HPF 0-2  <3 RBC/hpf  GLUCOSE, CAPILLARY     Status: Abnormal   Collection Time    08/04/13  4:52 PM      Result Value Ref Range   Glucose-Capillary 132 (*) 70 - 99 mg/dL  GLUCOSE, CAPILLARY     Status: Abnormal   Collection Time    08/04/13  8:05 PM      Result Value Ref Range   Glucose-Capillary 138 (*) 70 - 99 mg/dL   Comment 1 Notify RN    GLUCOSE, CAPILLARY     Status: Abnormal   Collection Time    08/05/13 12:07 AM      Result Value Ref Range   Glucose-Capillary 132 (*) 70 - 99 mg/dL  GLUCOSE, CAPILLARY     Status: Abnormal   Collection Time    08/05/13  3:31 AM      Result Value Ref Range   Glucose-Capillary 178 (*) 70 - 99 mg/dL  BASIC METABOLIC PANEL     Status: Abnormal   Collection Time    08/05/13  5:20 AM      Result Value Ref Range   Sodium 140  137 - 147 mEq/L   Potassium 4.3  3.7 - 5.3 mEq/L   Comment: MODERATE HEMOLYSIS     HEMOLYSIS AT THIS LEVEL MAY AFFECT RESULT   Chloride 97  96 - 112 mEq/L   CO2 31  19 - 32  mEq/L   Glucose, Bld 156 (*) 70 - 99 mg/dL   BUN 23  6 - 23 mg/dL   Creatinine, Ser 0.77  0.50 - 1.10 mg/dL   Calcium 9.4  8.4 - 10.5 mg/dL   GFR calc non Af Amer 80 (*) >90 mL/min   GFR calc Af Amer >90  >90 mL/min   Comment: (NOTE)     The eGFR has been calculated using the CKD EPI equation.     This calculation has not been validated in all clinical situations.     eGFR's persistently <90 mL/min signify possible Chronic Kidney     Disease.  CBC     Status: Abnormal   Collection Time    08/05/13  5:20 AM      Result Value Ref Range   WBC 5.6  4.0 - 10.5 K/uL   RBC 4.85  3.87 - 5.11 MIL/uL   Hemoglobin 14.4  12.0 - 15.0 g/dL   HCT 44.7  36.0 - 46.0 %   MCV 92.2  78.0 - 100.0 fL   MCH 29.7  26.0 - 34.0 pg   MCHC 32.2  30.0 - 36.0 g/dL   RDW 13.8  11.5 - 15.5 %   Platelets 146 (*)  150 - 400 K/uL  GLUCOSE, CAPILLARY     Status: Abnormal   Collection Time    08/05/13  7:11 AM      Result Value Ref Range   Glucose-Capillary 142 (*) 70 - 99 mg/dL  GLUCOSE, CAPILLARY     Status: Abnormal   Collection Time    08/05/13  1:21 PM      Result Value Ref Range   Glucose-Capillary 137 (*) 70 - 99 mg/dL  LACTIC ACID, PLASMA     Status: None   Collection Time    08/05/13  4:00 PM      Result Value Ref Range   Lactic Acid, Venous 0.9  0.5 - 2.2 mmol/L  GLUCOSE, CAPILLARY     Status: Abnormal   Collection Time    08/05/13  5:25 PM      Result Value Ref Range   Glucose-Capillary 137 (*) 70 - 99 mg/dL  GLUCOSE, CAPILLARY     Status: Abnormal   Collection Time    08/05/13  7:40 PM      Result Value Ref Range   Glucose-Capillary 138 (*) 70 - 99 mg/dL  GLUCOSE, CAPILLARY     Status: Abnormal   Collection Time    08/06/13 12:09 AM      Result Value Ref Range   Glucose-Capillary 165 (*) 70 - 99 mg/dL  GLUCOSE, CAPILLARY     Status: Abnormal   Collection Time    08/06/13  4:37 AM      Result Value Ref Range   Glucose-Capillary 159 (*) 70 - 99 mg/dL  CBC     Status: Abnormal   Collection Time    08/06/13  4:58 AM      Result Value Ref Range   WBC 5.7  4.0 - 10.5 K/uL   RBC 4.91  3.87 - 5.11 MIL/uL   Hemoglobin 14.8  12.0 - 15.0 g/dL   HCT 46.1 (*) 36.0 - 46.0 %   MCV 93.9  78.0 - 100.0 fL   MCH 30.1  26.0 - 34.0 pg   MCHC 32.1  30.0 - 36.0 g/dL   RDW 13.5  11.5 - 15.5 %   Platelets 126 (*) 150 - 400 K/uL  BASIC METABOLIC PANEL  Status: Abnormal   Collection Time    08/06/13  4:58 AM      Result Value Ref Range   Sodium 140  137 - 147 mEq/L   Potassium 3.9  3.7 - 5.3 mEq/L   Chloride 98  96 - 112 mEq/L   CO2 31  19 - 32 mEq/L   Glucose, Bld 153 (*) 70 - 99 mg/dL   BUN 17  6 - 23 mg/dL   Creatinine, Ser 0.76  0.50 - 1.10 mg/dL   Calcium 9.4  8.4 - 10.5 mg/dL   GFR calc non Af Amer 80 (*) >90 mL/min   GFR calc Af Amer >90  >90 mL/min   Comment: (NOTE)      The eGFR has been calculated using the CKD EPI equation.     This calculation has not been validated in all clinical situations.     eGFR's persistently <90 mL/min signify possible Chronic Kidney     Disease.  LACTIC ACID, PLASMA     Status: None   Collection Time    08/06/13  4:58 AM      Result Value Ref Range   Lactic Acid, Venous 0.9  0.5 - 2.2 mmol/L  GLUCOSE, CAPILLARY     Status: Abnormal   Collection Time    08/06/13  7:58 AM      Result Value Ref Range   Glucose-Capillary 148 (*) 70 - 99 mg/dL    Imaging / Studies: Ct Abdomen Pelvis W Contrast  08/04/2013   CLINICAL DATA:  Pain.  Emesis.  EXAM: CT ABDOMEN AND PELVIS WITH CONTRAST  TECHNIQUE: Multidetector CT imaging of the abdomen and pelvis was performed using the standard protocol following bolus administration of intravenous contrast.  CONTRAST:  39m OMNIPAQUE IOHEXOL 300 MG/ML SOLN, 1032mOMNIPAQUE IOHEXOL 300 MG/ML SOLN  COMPARISON:  None.  FINDINGS: Liver normal. Hepatic veins and portal vein patent. The spleen and splenic vein are normal. Pancreas normal. No biliary distention. The gallbladder is slightly distended. Multiple gallstones are present. No pericholecystic fluid collection or gallbladder wall thickening noted.  Adrenals normal. Tiny simple cysts and parapelvic cysts both kidneys. No hydronephrosis or obstructing calcified ureteral stones noted. Pelvic phleboliths. Bladder nondistended. No hydronephrosis. Calcified uterine fibroid. Adnexa unremarkable. Small amount of free pelvic fluid.  No significant adenopathy. Abdominal aorta is widely patent with atherosclerotic vascular changes. Visceral vessels are patent.  The appendix normal. Multiple dilated loops of small bowel are noted with distention up to 3.9 cm noted. These findings are consistent with small-bowel obstruction. This appears to be in the mid to distal jejunal region. Etiology of small bowel obstruction not identified. Decompressed loops of distal small bowel  noted. Large amount stool is noted within the colon. The colon is nondistended. No free air. No pneumatosis. Again mesenteric vessels are patent. Mesenteric venous structures are patent. No mesenteric mass. No hernia.  Large fat containing lesion noted in the left gluteal musculature. This measures 9.9 x 5.6 cm. Septations are noted within this fat containing density. A small 1.1 cm nodule in the lower aspect of this fatty mass is noted. Although this finding may represent a benign lipoma a more aggressive lesion such as a liposarcoma cannot be excluded. Nonemergent MRI of the pelvis with gadolinium enhancement is suggested. Heart size normal. Moderate right sided pleural effusion noted. Bibasilar atelectasis and/or infiltrates present. Degenerative changes lumbar spine and both hips. Tiny sclerotic density in the right hip, most likely a benign bone island.  IMPRESSION: 1. Small  bowel obstruction, most likely mid to distal jejunal. Etiology of obstruction is not identified. No pneumatosis, free air, or portal venous air. Mesenteric vasculature patent. 2. Multiple gallstones. Gallbladder slightly distended. No gallbladder wall thickening or pericholecystic fluid collections. 3. 9.9 x 5.6 cm fatty mass within the left gluteal musculature. This mass contains septations and a small 1.1 cm nodule in its inferior portion. Although this may represent a benign lipoma, a more aggressive lesion such as a liposarcoma cannot be excluded. Nonemergent gadolinium-enhanced MRI of the pelvis is suggested for further evaluation. 4. Small amount of free pelvic fluid. 5. Multiple calcified uterine fibroids. 6. Bibasilar atelectasis and/or infiltrates with moderate size right pleural effusion. These results were called by telephone at the time of interpretation on 08/04/2013 at 10:33 AM to Dr. Virgel Manifold , who verbally acknowledged these results.   Electronically Signed   By: Marcello Moores  Register   On: 08/04/2013 10:35   Dg Abd 2  Views  08/06/2013   CLINICAL DATA:  Bowel obstruction.  EXAM: ABDOMEN - 2 VIEW  COMPARISON:  08/05/2013.  FINDINGS: NG tube coiled in stomach. Persistent dilated loops of small bowel are noted consistent with small bowel obstruction. No free air. No acute bony abnormality. Aortoiliac atherosclerotic vascular disease. Basilar atelectasis and small right pleural effusion.  IMPRESSION: 1. NG tube medical stomach. 2. Persistent prominent distention of small bowel consistent with small bowel obstruction. No significant interim improvement. 3. Bibasilar atelectasis and small right pleural effusion.   Electronically Signed   By: Marcello Moores  Register   On: 08/06/2013 08:41   Dg Abd 2 Views  08/05/2013   CLINICAL DATA:  Abdominal pain and distention. Follow-up bowel obstruction.  EXAM: ABDOMEN - 2 VIEW  COMPARISON:  07/04/2013  FINDINGS: The patient now has a nasogastric tube which is coiled in the stomach region. There continues to be dilated loops of small bowel with air-fluid levels. The degree of small bowel distention has not significantly changed. There continues to be stool in the right colon. Mid small bowel loops measure up to 5.6 cm. Right basilar densities probably represent a combination of pleural fluid and atelectasis. Iodinated contrast in the urinary bladder. No clear evidence for free air.  IMPRESSION: Dilated loops of small bowel with air-fluid levels. The degree of small bowel distention has not decreased and findings are compatible with a high-grade small bowel obstruction.  Nasogastric tube is in the stomach region.  Right basilar chest densities are suggestive for pleural fluid and atelectasis.   Electronically Signed   By: Markus Daft M.D.   On: 08/05/2013 08:33    Scheduled Meds: . dorzolamide-timolol  1 drop Both Eyes BID  . heparin  5,000 Units Subcutaneous 3 times per day  . insulin aspart  0-9 Units Subcutaneous TID WC   Continuous Infusions: . dexrose 5 % and 0.45 % NaCl with KCl 30 mEq/L  100 mL/hr at 08/06/13 0609   PRN Meds:.labetalol, menthol-cetylpyridinium, morphine injection, ondansetron (ZOFRAN) IV, ondansetron, phenol   Antibiotics: Anti-infectives   None      Assessment/Plan 1. Small bowel obstruction, likely secondary to adhesions  Despite no change in AXR, she appears to be improving, continue with conservative management  Continue NPO, IV hydration NG decompression - allow ice chips  Encouraged ambulation and IS use  2. Soft tissue mass left gluteus muscle  Will require further evalution - likely MRI when able  3. Cholelithiasis  Asymptomatic at present  Erby Pian, [facility] Surgery Pager [phone] Office [phone]  08/06/2013 8:51 AM

## 2013-08-06 NOTE — Progress Notes (Addendum)
[facility] PROGRESS NOTE  Ronna Poliolla Creed ZOX:[id number] DOB: Jan 16, 1935 DOA: 08/04/2013 PCP: No primary provider on file.  Assessment/Plan: Active Problems:   SBO (small bowel obstruction) - General surgery on board and managing. - Agree with increasing ambulation and discussed with patient of which patient verbalizes agreement and understanding. - Abdominal x-ray reports no significant interim improvement    HTN (hypertension) - given npo status holding oral antihypertensive medication - Labetalol 5 mg IV prn elevated blood pressure  Addendum: Soft tissue mass left gluteus muscle After improvement of principal problem we'll plan on obtaining MRI for further evaluation as recommended by the radiologist.  Code Status: Full code Family Communication: Discussed with patient and family members at bedside  Disposition Plan: Pending further evaluation and recommendations from general surgeon   Consultants:  General surgery  Procedures:  none  Antibiotics:  none  HPI/Subjective: Pt has no new complaints. No acute issues overnight reported  Objective: Filed Vitals:   08/06/13 1309  BP: 157/98  Pulse: 76  Temp: 98.3 F (36.8 C)  Resp: 18    Intake/Output Summary (Last 24 hours) at 08/06/13 1347 Last data filed at 08/06/13 1153  Gross per 24 hour  Intake 1853.33 ml  Output   2900 ml  Net -1046.67 ml   There were no vitals filed for this visit.  Exam:   General:  Patient in no acute distress, alert and awake  Cardiovascular: Regular rhythm and rate, no murmurs or rubs  Respiratory: Clear to auscultation bilaterally, no wheezes  Abdomen: Hypoactive bowel sounds, distention, soft  Musculoskeletal: No cyanosis or clubbing   Data Reviewed: Basic Metabolic Panel:  Recent Labs Lab 08/04/13 0853 08/05/13 0520 08/06/13 0458  NA 136* 140 140  K 4.0 4.3 3.9  CL 94* 97 98  CO2 26 31 31   GLUCOSE 168* 156* 153*  BUN 26* 23 17  CREATININE 0.78 0.77 0.76   CALCIUM 10.6* 9.4 9.4  MG 2.0  --   --   PHOS 4.6  --   --    Liver Function Tests:  Recent Labs Lab 08/04/13 0853  AST 80*  ALT 64*  ALKPHOS 76  BILITOT 1.2  PROT 8.0  ALBUMIN 3.9    Recent Labs Lab 08/04/13 0853  LIPASE 16   No results found for this basename: AMMONIA,  in the last 168 hours CBC:  Recent Labs Lab 08/04/13 0853 08/05/13 0520 08/06/13 0458  WBC 7.9 5.6 5.7  NEUTROABS 6.3  --   --   HGB 16.1* 14.4 14.8  HCT 47.2* 44.7 46.1*  MCV 89.1 92.2 93.9  PLT 153 146* 126*   Cardiac Enzymes: No results found for this basename: CKTOTAL, CKMB, CKMBINDEX, TROPONINI,  in the last 168 hours BNP (last 3 results) No results found for this basename: PROBNP,  in the last 8760 hours CBG:  Recent Labs Lab 08/05/13 1940 08/06/13 0009 08/06/13 0437 08/06/13 0758 08/06/13 1149  GLUCAP 138* 165* 159* 148* 132*    No results found for this or any previous visit (from the past 240 hour(s)).   Studies: Dg Abd 2 Views  08/06/2013   CLINICAL DATA:  Bowel obstruction.  EXAM: ABDOMEN - 2 VIEW  COMPARISON:  08/05/2013.  FINDINGS: NG tube coiled in stomach. Persistent dilated loops of small bowel are noted consistent with small bowel obstruction. No free air. No acute bony abnormality. Aortoiliac atherosclerotic vascular disease. Basilar atelectasis and small right pleural effusion.  IMPRESSION: 1. NG tube medical stomach. 2. Persistent prominent distention  of small bowel consistent with small bowel obstruction. No significant interim improvement. 3. Bibasilar atelectasis and small right pleural effusion.   Electronically Signed   By: Maisie Fushomas  Register   On: 08/06/2013 08:41   Dg Abd 2 Views  08/05/2013   CLINICAL DATA:  Abdominal pain and distention. Follow-up bowel obstruction.  EXAM: ABDOMEN - 2 VIEW  COMPARISON:  07/04/2013  FINDINGS: The patient now has a nasogastric tube which is coiled in the stomach region. There continues to be dilated loops of small bowel with  air-fluid levels. The degree of small bowel distention has not significantly changed. There continues to be stool in the right colon. Mid small bowel loops measure up to 5.6 cm. Right basilar densities probably represent a combination of pleural fluid and atelectasis. Iodinated contrast in the urinary bladder. No clear evidence for free air.  IMPRESSION: Dilated loops of small bowel with air-fluid levels. The degree of small bowel distention has not decreased and findings are compatible with a high-grade small bowel obstruction.  Nasogastric tube is in the stomach region.  Right basilar chest densities are suggestive for pleural fluid and atelectasis.   Electronically Signed   By: Richarda OverlieAdam  Henn M.D.   On: 08/05/2013 08:33    Scheduled Meds: . dorzolamide-timolol  1 drop Both Eyes BID  . heparin  5,000 Units Subcutaneous 3 times per day  . insulin aspart  0-9 Units Subcutaneous TID WC   Continuous Infusions: . dexrose 5 % and 0.45 % NaCl with KCl 30 mEq/L 100 mL/hr at 08/06/13 0609    Time spent: > 35 minutes    Penny PiaVEGA, Bab[place]  [facility] Pager [phone] If 7PM-7AM, please contact night-coverage at [email or web address], password [facility] San[facility]1 08/06/2013, 1:47 PM  LOS: 2 days

## 2013-08-07 ENCOUNTER — Inpatient Hospital Stay (HOSPITAL_COMMUNITY): Payer: Medicare (Managed Care)

## 2013-08-07 LAB — LACTIC ACID, PLASMA: LACTIC ACID, VENOUS: 1 mmol/L (ref 0.5–2.2)

## 2013-08-07 LAB — GLUCOSE, CAPILLARY
GLUCOSE-CAPILLARY: 159 mg/dL — AB (ref 70–99)
Glucose-Capillary: 145 mg/dL — ABNORMAL HIGH (ref 70–99)
Glucose-Capillary: 153 mg/dL — ABNORMAL HIGH (ref 70–99)
Glucose-Capillary: 121 mg/dL — ABNORMAL HIGH (ref 70–99)

## 2013-08-07 NOTE — Progress Notes (Signed)
[facility] PROGRESS NOTE  Bonnie Briggs [id number] DOB: Jun 12, 1934 DOA: 08/04/2013 PCP: No primary provider on file. Brief narrative: Patient is a 79 year old with history of cesarean section many years ago. Her presenting to the hospital admission with abdominal discomfort and diagnosed with small bowel obstruction on imaging studies. General surgery on board and assisting with management.  Assessment/Plan: Active Problems:   SBO (small bowel obstruction) - General surgery on board and managing. - Agree with increasing ambulation  - Still with significant NG tube output and no flatus. Gen surg monitoring     HTN (hypertension) - given npo status holding oral antihypertensive medication - Labetalol 5 mg IV prn elevated blood pressure  Soft tissue mass left gluteus muscle - After improvement of principal problem we'll plan on obtaining MRI for further evaluation as recommended by the radiologist.  Code Status: Full code Family Communication: Discussed with patient and family members at bedside  Disposition Plan: Pending further evaluation and recommendations from general surgeon   Consultants:  General surgery  Procedures:  none  Antibiotics:  none  HPI/Subjective: Pt has no new complaints. No acute issues overnight reported  Objective: Filed Vitals:   08/07/13 1334  BP: 124/81  Pulse: 82  Temp: 97.9 F (36.6 C)  Resp:     Intake/Output Summary (Last 24 hours) at 08/07/13 1642 Last data filed at 08/07/13 1000  Gross per 24 hour  Intake 2243.34 ml  Output   2605 ml  Net -361.66 ml   Filed Weights   08/07/13 0500  Weight: 81.647 kg (180 lb)    Exam:   General:  Patient in no acute distress, alert and awake  Cardiovascular: Regular rhythm and rate, no murmurs or rubs  Respiratory: Clear to auscultation bilaterally, no wheezes  Abdomen: Hypoactive bowel sounds, distention, soft  Musculoskeletal: No cyanosis or clubbing   Data  Reviewed: Basic Metabolic Panel:  Recent Labs Lab 08/04/13 0853 08/05/13 0520 08/06/13 0458  NA 136* 140 140  K 4.0 4.3 3.9  CL 94* 97 98  CO2 26 31 31   GLUCOSE 168* 156* 153*  BUN 26* 23 17  CREATININE 0.78 0.77 0.76  CALCIUM 10.6* 9.4 9.4  MG 2.0  --   --   PHOS 4.6  --   --    Liver Function Tests:  Recent Labs Lab 08/04/13 0853  AST 80*  ALT 64*  ALKPHOS 76  BILITOT 1.2  PROT 8.0  ALBUMIN 3.9    Recent Labs Lab 08/04/13 0853  LIPASE 16   No results found for this basename: AMMONIA,  in the last 168 hours CBC:  Recent Labs Lab 08/04/13 0853 08/05/13 0520 08/06/13 0458  WBC 7.9 5.6 5.7  NEUTROABS 6.3  --   --   HGB 16.1* 14.4 14.8  HCT 47.2* 44.7 46.1*  MCV 89.1 92.2 93.9  PLT 153 146* 126*   Cardiac Enzymes: No results found for this basename: CKTOTAL, CKMB, CKMBINDEX, TROPONINI,  in the last 168 hours BNP (last 3 results) No results found for this basename: PROBNP,  in the last 8760 hours CBG:  Recent Labs Lab 08/06/13 1558 08/06/13 1938 08/06/13 2315 08/07/13 0728 08/07/13 1314  GLUCAP 139* 163* 165* 145* 153*    No results found for this or any previous visit (from the past 240 hour(s)).   Studies: Dg Abd 2 Views  08/07/2013   CLINICAL DATA:  Nausea, small bowel obstruction  EXAM: ABDOMEN - 2 VIEW  COMPARISON:  08/06/2013  FINDINGS: A nasogastric catheter  remains within the stomach. Multiple dilated small bowel loops are noted. Air-fluid levels are again seen. No definitive free air is noted. The overall appearance is stable from the prior exam. Persistent atelectatic changes are noted in the bases bilaterally.  IMPRESSION: Stable small bowel obstruction.  Continued followup is recommended.   Electronically Signed   By: Alcide CleverMark  Lukens M.D.   On: 08/07/2013 08:17   Dg Abd 2 Views  08/06/2013   CLINICAL DATA:  Bowel obstruction.  EXAM: ABDOMEN - 2 VIEW  COMPARISON:  08/05/2013.  FINDINGS: NG tube coiled in stomach. Persistent dilated loops  of small bowel are noted consistent with small bowel obstruction. No free air. No acute bony abnormality. Aortoiliac atherosclerotic vascular disease. Basilar atelectasis and small right pleural effusion.  IMPRESSION: 1. NG tube medical stomach. 2. Persistent prominent distention of small bowel consistent with small bowel obstruction. No significant interim improvement. 3. Bibasilar atelectasis and small right pleural effusion.   Electronically Signed   By: Maisie Fushomas  Register   On: 08/06/2013 08:41    Scheduled Meds: . dorzolamide-timolol  1 drop Both Eyes BID  . heparin  5,000 Units Subcutaneous 3 times per day  . insulin aspart  0-9 Units Subcutaneous TID WC   Continuous Infusions: . dexrose 5 % and 0.45 % NaCl with KCl 30 mEq/L 100 mL/hr (08/07/13 1511)    Time spent: > 35 minutes    Penny PiaVEGA, Bonnie  [facility] Pager [phone] If 7PM-7AM, please contact night-coverage at [email or web address], password [email or web address] 08/07/2013, 4:42 PM  LOS: 3 days

## 2013-08-07 NOTE — Progress Notes (Signed)
xrays reviewed.  Discussed with family

## 2013-08-07 NOTE — Progress Notes (Signed)
Patient ID: Bonnie Briggs, female   DOB: 10/30/1934, 79 y.o.   MRN: [id number]    Subjective: Pt having minimal flatus.  Had a BM yesterday, but nothing since  Objective: Vital signs in last 24 hours: Temp:  [97.7 F (36.5 C)-98.3 F (36.8 C)] 97.9 F (36.6 C) (06/16 0522) Pulse Rate:  [76-100] 87 (06/16 0522) Resp:  [16-18] 16 (06/16 0522) BP: (118-157)/(64-98) 135/84 mmHg (06/16 0522) SpO2:  [91 %-93 %] 93 % (06/16 0522) Weight:  [180 lb (81.647 kg)] 180 lb (81.647 kg) (06/16 0500) Last BM Date: 08/05/13  Intake/Output from previous day: 06/15 0701 - 06/16 0700 In: 2343.3 [I.V.:2343.3] Out: 3255 [Urine:625; Emesis/NG output:2630] Intake/Output this shift: Total I/O In: -  Out: 200 [Urine:200]  PE: Abd: soft, NT, NGT with copious amounts of bilious output, some distention  Lab Results:   Recent Labs  08/05/13 0520 08/06/13 0458  WBC 5.6 5.7  HGB 14.4 14.8  HCT 44.7 46.1*  PLT 146* 126*   BMET  Recent Labs  08/05/13 0520 08/06/13 0458  NA 140 140  K 4.3 3.9  CL 97 98  CO2 31 31  GLUCOSE 156* 153*  BUN 23 17  CREATININE 0.77 0.76  CALCIUM 9.4 9.4   PT/INR No results found for this basename: LABPROT, INR,  in the last 72 hours CMP     Component Value Date/Time   NA 140 08/06/2013 0458   K 3.9 08/06/2013 0458   CL 98 08/06/2013 0458   CO2 31 08/06/2013 0458   GLUCOSE 153* 08/06/2013 0458   BUN 17 08/06/2013 0458   CREATININE 0.76 08/06/2013 0458   CALCIUM 9.4 08/06/2013 0458   PROT 8.0 08/04/2013 0853   ALBUMIN 3.9 08/04/2013 0853   AST 80* 08/04/2013 0853   ALT 64* 08/04/2013 0853   ALKPHOS 76 08/04/2013 0853   BILITOT 1.2 08/04/2013 0853   GFRNONAA 80* 08/06/2013 0458   GFRAA >90 08/06/2013 0458   Lipase     Component Value Date/Time   LIPASE 16 08/04/2013 0853       Studies/Results: Dg Abd 2 Views  08/07/2013   CLINICAL DATA:  Nausea, small bowel obstruction  EXAM: ABDOMEN - 2 VIEW  COMPARISON:  08/06/2013  FINDINGS: A nasogastric catheter remains  within the stomach. Multiple dilated small bowel loops are noted. Air-fluid levels are again seen. No definitive free air is noted. The overall appearance is stable from the prior exam. Persistent atelectatic changes are noted in the bases bilaterally.  IMPRESSION: Stable small bowel obstruction.  Continued followup is recommended.   Electronically Signed   By: Alcide CleverMark  Lukens M.D.   On: 08/07/2013 08:17   Dg Abd 2 Views  08/06/2013   CLINICAL DATA:  Bowel obstruction.  EXAM: ABDOMEN - 2 VIEW  COMPARISON:  08/05/2013.  FINDINGS: NG tube coiled in stomach. Persistent dilated loops of small bowel are noted consistent with small bowel obstruction. No free air. No acute bony abnormality. Aortoiliac atherosclerotic vascular disease. Basilar atelectasis and small right pleural effusion.  IMPRESSION: 1. NG tube medical stomach. 2. Persistent prominent distention of small bowel consistent with small bowel obstruction. No significant interim improvement. 3. Bibasilar atelectasis and small right pleural effusion.   Electronically Signed   By: Maisie Fushomas  Register   On: 08/06/2013 08:41    Anti-infectives: Anti-infectives   None       Assessment/Plan  1. SBO  Plan: 1. Patient had a BM the other day, but still has 2000+cc of NGT output that is  bilious, not passing flatus, and films do not show any improvement.  Had a long d/w the patient, her daughter, and granddaughter about the fact that she may require an operation if she does not get better within the next 1-2 days.   LOS: 3 days    OSBORNE,KELLY E 08/07/2013, 11:53 AM Pager: [phone]

## 2013-08-08 ENCOUNTER — Inpatient Hospital Stay (HOSPITAL_COMMUNITY): Payer: Medicare (Managed Care)

## 2013-08-08 ENCOUNTER — Encounter (HOSPITAL_COMMUNITY): Payer: Self-pay | Admitting: Anesthesiology

## 2013-08-08 ENCOUNTER — Encounter (HOSPITAL_COMMUNITY): Payer: Medicare (Managed Care) | Admitting: Anesthesiology

## 2013-08-08 ENCOUNTER — Inpatient Hospital Stay (HOSPITAL_COMMUNITY): Payer: Medicare (Managed Care) | Admitting: Anesthesiology

## 2013-08-08 ENCOUNTER — Encounter (HOSPITAL_COMMUNITY): Admission: EM | Disposition: A | Discharge status: Home / Self Care | Payer: Self-pay | Source: Home / Self Care

## 2013-08-08 DIAGNOSIS — K565 Intestinal adhesions [bands], unspecified as to partial versus complete obstruction: Secondary | ICD-10-CM

## 2013-08-08 DIAGNOSIS — Z9889 Other specified postprocedural states: Secondary | ICD-10-CM

## 2013-08-08 DIAGNOSIS — I10 Essential (primary) hypertension: Secondary | ICD-10-CM

## 2013-08-08 HISTORY — PX: LAPAROTOMY: SHX154

## 2013-08-08 LAB — GLUCOSE, CAPILLARY
Glucose-Capillary: 144 mg/dL — ABNORMAL HIGH (ref 70–99)
Glucose-Capillary: 131 mg/dL — ABNORMAL HIGH (ref 70–99)
Glucose-Capillary: 113 mg/dL — ABNORMAL HIGH (ref 70–99)
Glucose-Capillary: 127 mg/dL — ABNORMAL HIGH (ref 70–99)
Glucose-Capillary: 130 mg/dL — ABNORMAL HIGH (ref 70–99)

## 2013-08-08 LAB — CBC
HEMATOCRIT: 43.7 % (ref 36.0–46.0)
HEMOGLOBIN: 13.9 g/dL (ref 12.0–15.0)
MCH: 30.2 pg (ref 26.0–34.0)
MCHC: 31.8 g/dL (ref 30.0–36.0)
MCV: 94.8 fL (ref 78.0–100.0)
Platelets: 133 10*3/uL — ABNORMAL LOW (ref 150–400)
RBC: 4.61 MIL/uL (ref 3.87–5.11)
RDW: 13.5 % (ref 11.5–15.5)
WBC: 8.8 10*3/uL (ref 4.0–10.5)

## 2013-08-08 LAB — CREATININE, SERUM
Creatinine, Ser: 0.76 mg/dL (ref 0.50–1.10)
GFR calc Af Amer: >90 mL/min (ref >90)
GFR, EST NON AFRICAN AMERICAN: 80 mL/min — AB (ref >90)

## 2013-08-08 LAB — SURGICAL PCR SCREEN
MRSA, PCR: NEGATIVE
Staphylococcus aureus: NEGATIVE

## 2013-08-08 LAB — LACTIC ACID, PLASMA: LACTIC ACID, VENOUS: 0.9 mmol/L (ref 0.5–2.2)

## 2013-08-08 SURGERY — LAPAROTOMY, EXPLORATORY
Anesthesia: General | Site: Abdomen

## 2013-08-08 MED ORDER — SUFENTANIL CITRATE 50 MCG/ML IV SOLN
INTRAVENOUS | Status: DC | PRN
  Administered 2013-08-08: 10 ug via INTRAVENOUS
  Administered 2013-08-08: 15 ug via INTRAVENOUS
  Administered 2013-08-08: 5 ug via INTRAVENOUS

## 2013-08-08 MED ORDER — PHENYLEPHRINE HCL 10 MG/ML IJ SOLN
INTRAMUSCULAR | Status: DC | PRN
  Administered 2013-08-08: 120 ug via INTRAVENOUS
  Administered 2013-08-08 (×2): 80 ug via INTRAVENOUS

## 2013-08-08 MED ORDER — PROPOFOL 10 MG/ML IV BOLUS
INTRAVENOUS | Status: AC
  Filled 2013-08-08: qty 20

## 2013-08-08 MED ORDER — HYDROCODONE-ACETAMINOPHEN 5-325 MG PO TABS
1.0000 | ORAL_TABLET | ORAL | Status: DC | PRN
  Administered 2013-08-10 – 2013-08-11 (×2): 2 via ORAL
  Filled 2013-08-08 (×2): qty 2

## 2013-08-08 MED ORDER — DEXTROSE 5 % IV SOLN
2.0000 g | INTRAVENOUS | Status: AC
  Administered 2013-08-08: 2 g via INTRAVENOUS
  Filled 2013-08-08: qty 2

## 2013-08-08 MED ORDER — CISATRACURIUM BESYLATE 20 MG/10ML IV SOLN
INTRAVENOUS | Status: AC
  Filled 2013-08-08: qty 10

## 2013-08-08 MED ORDER — ONDANSETRON HCL 4 MG/2ML IJ SOLN
INTRAMUSCULAR | Status: AC
  Filled 2013-08-08: qty 2

## 2013-08-08 MED ORDER — SODIUM CHLORIDE 0.9 % IJ SOLN
INTRAMUSCULAR | Status: AC
  Filled 2013-08-08: qty 10

## 2013-08-08 MED ORDER — MORPHINE SULFATE 2 MG/ML IJ SOLN
1.0000 mg | INTRAMUSCULAR | Status: DC | PRN
  Administered 2013-08-08 – 2013-08-12 (×11): 1 mg via INTRAVENOUS
  Filled 2013-08-08 (×11): qty 1

## 2013-08-08 MED ORDER — HYDROMORPHONE HCL PF 1 MG/ML IJ SOLN
0.2500 mg | INTRAMUSCULAR | Status: DC | PRN
  Administered 2013-08-08 (×2): 0.5 mg via INTRAVENOUS

## 2013-08-08 MED ORDER — NEOSTIGMINE METHYLSULFATE 10 MG/10ML IV SOLN
INTRAVENOUS | Status: DC | PRN
  Administered 2013-08-08: 4 mg via INTRAVENOUS

## 2013-08-08 MED ORDER — SUFENTANIL CITRATE 50 MCG/ML IV SOLN
INTRAVENOUS | Status: AC
  Filled 2013-08-08: qty 1

## 2013-08-08 MED ORDER — PROMETHAZINE HCL 25 MG/ML IJ SOLN: 6.2500 mg | INTRAMUSCULAR | Status: DC | PRN

## 2013-08-08 MED ORDER — PHENYLEPHRINE 40 MCG/ML (10ML) SYRINGE FOR IV PUSH (FOR BLOOD PRESSURE SUPPORT)
PREFILLED_SYRINGE | INTRAVENOUS | Status: AC
  Filled 2013-08-08: qty 10

## 2013-08-08 MED ORDER — INSULIN ASPART 100 UNIT/ML ~~LOC~~ SOLN
0.0000 [IU] | SUBCUTANEOUS | Status: DC
  Administered 2013-08-08 – 2013-08-11 (×5): 2 [IU] via SUBCUTANEOUS
  Administered 2013-08-11 (×2): 3 [IU] via SUBCUTANEOUS
  Administered 2013-08-11: 2 [IU] via SUBCUTANEOUS
  Administered 2013-08-12: 3 [IU] via SUBCUTANEOUS
  Administered 2013-08-12 (×2): 2 [IU] via SUBCUTANEOUS
  Administered 2013-08-12: 3 [IU] via SUBCUTANEOUS
  Administered 2013-08-12 – 2013-08-14 (×5): 2 [IU] via SUBCUTANEOUS

## 2013-08-08 MED ORDER — ONDANSETRON HCL 4 MG/2ML IJ SOLN
4.0000 mg | Freq: Four times a day (QID) | INTRAMUSCULAR | Status: DC | PRN
  Administered 2013-08-11 – 2013-08-12 (×3): 4 mg via INTRAVENOUS
  Filled 2013-08-08 (×3): qty 2

## 2013-08-08 MED ORDER — ONDANSETRON HCL 4 MG/2ML IJ SOLN
INTRAMUSCULAR | Status: DC | PRN
  Administered 2013-08-08: 4 mg via INTRAVENOUS

## 2013-08-08 MED ORDER — SODIUM CHLORIDE 0.9 % IV SOLN
20.0000 mg | INTRAVENOUS | Status: DC | PRN
  Administered 2013-08-08: 50 ug/min via INTRAVENOUS

## 2013-08-08 MED ORDER — DEXTROSE 5 % IV SOLN
1.0000 g | Freq: Four times a day (QID) | INTRAVENOUS | Status: AC
  Administered 2013-08-08: 1 g via INTRAVENOUS
  Filled 2013-08-08: qty 1

## 2013-08-08 MED ORDER — PHENYLEPHRINE HCL 10 MG/ML IJ SOLN
INTRAMUSCULAR | Status: AC
  Filled 2013-08-08: qty 2

## 2013-08-08 MED ORDER — HEPARIN SODIUM (PORCINE) 5000 UNIT/ML IJ SOLN
5000.0000 [IU] | Freq: Three times a day (TID) | INTRAMUSCULAR | Status: DC
  Administered 2013-08-09 – 2013-08-15 (×19): 5000 [IU] via SUBCUTANEOUS
  Filled 2013-08-08 (×22): qty 1

## 2013-08-08 MED ORDER — LACTATED RINGERS IV SOLN
INTRAVENOUS | Status: DC
  Administered 2013-08-08 (×2): 1000 mL via INTRAVENOUS

## 2013-08-08 MED ORDER — LIDOCAINE HCL (CARDIAC) 20 MG/ML IV SOLN
INTRAVENOUS | Status: DC | PRN
  Administered 2013-08-08: 75 mg via INTRAVENOUS

## 2013-08-08 MED ORDER — DEXAMETHASONE SODIUM PHOSPHATE 10 MG/ML IJ SOLN
INTRAMUSCULAR | Status: AC
  Filled 2013-08-08: qty 1

## 2013-08-08 MED ORDER — LIDOCAINE HCL (CARDIAC) 20 MG/ML IV SOLN
INTRAVENOUS | Status: AC
  Filled 2013-08-08: qty 5

## 2013-08-08 MED ORDER — PROPOFOL 10 MG/ML IV BOLUS
INTRAVENOUS | Status: DC | PRN
  Administered 2013-08-08: 150 mg via INTRAVENOUS

## 2013-08-08 MED ORDER — EPHEDRINE SULFATE 50 MG/ML IJ SOLN
INTRAMUSCULAR | Status: DC | PRN
  Administered 2013-08-08: 10 mg via INTRAVENOUS

## 2013-08-08 MED ORDER — ONDANSETRON HCL 4 MG PO TABS: 4.0000 mg | ORAL_TABLET | Freq: Four times a day (QID) | ORAL | Status: DC | PRN

## 2013-08-08 MED ORDER — EPHEDRINE SULFATE 50 MG/ML IJ SOLN
INTRAMUSCULAR | Status: AC
  Filled 2013-08-08: qty 1

## 2013-08-08 MED ORDER — DEXTROSE 5 % IV SOLN
INTRAVENOUS | Status: AC
  Filled 2013-08-08: qty 2

## 2013-08-08 MED ORDER — HYDROMORPHONE HCL PF 1 MG/ML IJ SOLN
INTRAMUSCULAR | Status: AC
  Filled 2013-08-08: qty 1

## 2013-08-08 MED ORDER — DEXAMETHASONE SODIUM PHOSPHATE 10 MG/ML IJ SOLN
INTRAMUSCULAR | Status: DC | PRN
  Administered 2013-08-08: 10 mg via INTRAVENOUS

## 2013-08-08 MED ORDER — GLYCOPYRROLATE 0.2 MG/ML IJ SOLN
INTRAMUSCULAR | Status: DC | PRN
  Administered 2013-08-08: .6 mg via INTRAVENOUS

## 2013-08-08 MED ORDER — CISATRACURIUM BESYLATE (PF) 10 MG/5ML IV SOLN
INTRAVENOUS | Status: DC | PRN
  Administered 2013-08-08: 8 mg via INTRAVENOUS

## 2013-08-08 MED ORDER — KETOROLAC TROMETHAMINE 30 MG/ML IJ SOLN: 15.0000 mg | Freq: Once | INTRAMUSCULAR | Status: DC | PRN

## 2013-08-08 MED ORDER — SUCCINYLCHOLINE CHLORIDE 20 MG/ML IJ SOLN
INTRAMUSCULAR | Status: DC | PRN
  Administered 2013-08-08: 100 mg via INTRAVENOUS

## 2013-08-08 MED ORDER — POTASSIUM CHLORIDE IN NACL 20-0.45 MEQ/L-% IV SOLN
INTRAVENOUS | Status: DC
  Administered 2013-08-08 – 2013-08-09 (×2): via INTRAVENOUS
  Filled 2013-08-08 (×6): qty 1000

## 2013-08-08 SURGICAL SUPPLY — 35 items
APPLICATOR COTTON TIP 6IN STRL (MISCELLANEOUS) ×2 IMPLANT
BLADE EXTENDED COATED 6.5IN (ELECTRODE) ×2 IMPLANT
BLADE HEX COATED 2.75 (ELECTRODE) ×2 IMPLANT
CANISTER SUCTION 2500CC (MISCELLANEOUS) ×2 IMPLANT
COVER MAYO STAND STRL (DRAPES) ×2 IMPLANT
DRAIN CHANNEL 19F RND (DRAIN) IMPLANT
DRAPE LAPAROSCOPIC ABDOMINAL (DRAPES) ×2 IMPLANT
DRAPE WARM FLUID 44X44 (DRAPE) ×2 IMPLANT
ELECT REM PT RETURN 9FT ADLT (ELECTROSURGICAL) ×2
ELECTRODE REM PT RTRN 9FT ADLT (ELECTROSURGICAL) ×1 IMPLANT
EVACUATOR SILICONE 100CC (DRAIN) IMPLANT
GAUZE SPONGE 4X4 12PLY STRL (GAUZE/BANDAGES/DRESSINGS) ×2 IMPLANT
GLOVE BIOGEL M 8.0 STRL (GLOVE) ×2 IMPLANT
GOWN SPEC L4 XLG W/TWL (GOWN DISPOSABLE) ×2 IMPLANT
GOWN STRL REUS W/TWL XL LVL3 (GOWN DISPOSABLE) ×6 IMPLANT
KIT BASIN OR (CUSTOM PROCEDURE TRAY) ×2 IMPLANT
LIGASURE IMPACT 36 18CM CVD LR (INSTRUMENTS) IMPLANT
NS IRRIG 1000ML POUR BTL (IV SOLUTION) ×4 IMPLANT
PACK GENERAL/GYN (CUSTOM PROCEDURE TRAY) ×2 IMPLANT
SPONGE LAP 18X18 X RAY DECT (DISPOSABLE) ×4 IMPLANT
STAPLER VISISTAT 35W (STAPLE) ×2 IMPLANT
SUCTION POOLE TIP (SUCTIONS) ×2 IMPLANT
SUT PDS AB 1 CTX 36 (SUTURE) IMPLANT
SUT SILK 2 0 (SUTURE) ×1
SUT SILK 2 0 SH CR/8 (SUTURE) ×2 IMPLANT
SUT SILK 2-0 18XBRD TIE 12 (SUTURE) ×1 IMPLANT
SUT SILK 3 0 (SUTURE) ×1
SUT SILK 3 0 SH CR/8 (SUTURE) ×2 IMPLANT
SUT SILK 3-0 18XBRD TIE 12 (SUTURE) ×1 IMPLANT
SUT VIC AB 3-0 SH 18 (SUTURE) IMPLANT
SUT VICRYL 2 0 18  UND BR (SUTURE)
SUT VICRYL 2 0 18 UND BR (SUTURE) IMPLANT
TOWEL OR 17X26 10 PK STRL BLUE (TOWEL DISPOSABLE) ×2 IMPLANT
TOWEL OR NON WOVEN STRL DISP B (DISPOSABLE) ×2 IMPLANT
TRAY FOLEY CATH 14FRSI W/METER (CATHETERS) ×2 IMPLANT

## 2013-08-08 NOTE — Anesthesia Procedure Notes (Signed)
Procedure Name: Intubation Date/Time: 08/08/2013 11:28 AM Performed by: Leroy LibmanEARDON, Lilliah Priego L Patient Re-evaluated:Patient Re-evaluated prior to inductionOxygen Delivery Method: Circle system utilized Intubation Type: IV induction, Rapid sequence and Cricoid Pressure applied Laryngoscope Size: Miller and 2 Grade View: Grade I Tube type: Oral Tube size: 7.5 mm Number of attempts: 1 Airway Equipment and Method: Stylet Placement Confirmation: ETT inserted through vocal cords under direct vision,  breath sounds checked- equal and bilateral and positive ETCO2 Secured at: 21 cm Tube secured with: Tape Dental Injury: Teeth and Oropharynx as per pre-operative assessment  Comments: Patient refused to remove upper and lower dentures after explanation about safety concerns.  Attempted to remove but were glued in too tight.  No damage to dentures on intubation

## 2013-08-08 NOTE — Anesthesia Preprocedure Evaluation (Addendum)
Anesthesia Evaluation  Patient identified by MRN, date of birth, ID band Patient awake    Reviewed: Allergy & Precautions, H&P , NPO status , Patient's Chart, lab work & pertinent test results  Airway Mallampati: II TM Distance: >3 FB Neck ROM: Full    Dental  (+) Upper Dentures, Lower Dentures   Pulmonary neg pulmonary ROS,  breath sounds clear to auscultation  Pulmonary exam normal       Cardiovascular hypertension, Pt. on medications Rhythm:Regular Rate:Normal     Neuro/Psych negative neurological ROS  negative psych ROS   GI/Hepatic negative GI ROS, Neg liver ROS,   Endo/Other  diabetes  Renal/GU negative Renal ROS  negative genitourinary   Musculoskeletal negative musculoskeletal ROS (+)   Abdominal   Peds negative pediatric ROS (+)  Hematology negative hematology ROS (+)   Anesthesia Other Findings Patient refuses to remove dentures  Reproductive/Obstetrics negative OB ROS                         Anesthesia Physical Anesthesia Plan  ASA: II  Anesthesia Plan: General   Post-op Pain Management:    Induction: Intravenous, Rapid sequence and Cricoid pressure planned  Airway Management Planned: Oral ETT  Additional Equipment:   Intra-op Plan:   Post-operative Plan: Extubation in OR  Informed Consent: I have reviewed the patients History and Physical, chart, labs and discussed the procedure including the risks, benefits and alternatives for the proposed anesthesia with the patient or authorized representative who has indicated his/her understanding and acceptance.   Dental advisory given  Plan Discussed with: CRNA and Surgeon  Anesthesia Plan Comments:         Anesthesia Quick Evaluation

## 2013-08-08 NOTE — Progress Notes (Signed)
Patient ID: Bonnie Briggs, female   DOB: October 22, 1934, 79 y.o.   MRN: [id number]    Subjective: Pt no better.  No flatus.  NGT still with high bilious output.  Objective: Vital signs in last 24 hours: Temp:  [97.5 F (36.4 C)-99 F (37.2 C)] 97.5 F (36.4 C) (06/17 0625) Pulse Rate:  [82-100] 100 (06/17 0625) Resp:  [16] 16 (06/17 0625) BP: (107-124)/(74-81) 107/74 mmHg (06/17 0625) SpO2:  [94 %] 94 % (06/17 0625) Last BM Date: 08/05/13  Intake/Output from previous day: 06/16 0701 - 06/17 0700 In: 2393.3 [I.V.:2343.3; NG/GT:50] Out: 1750 [Urine:750; Emesis/NG output:1000] Intake/Output this shift:    PE: Abd: soft, some distention, minimally tender, NGT with bilious output  Lab Results:   Recent Labs  08/06/13 0458  WBC 5.7  HGB 14.8  HCT 46.1*  PLT 126*   BMET  Recent Labs  08/06/13 0458  NA 140  K 3.9  CL 98  CO2 31  GLUCOSE 153*  BUN 17  CREATININE 0.76  CALCIUM 9.4   PT/INR No results found for this basename: LABPROT, INR,  in the last 72 hours CMP     Component Value Date/Time   NA 140 08/06/2013 0458   K 3.9 08/06/2013 0458   CL 98 08/06/2013 0458   CO2 31 08/06/2013 0458   GLUCOSE 153* 08/06/2013 0458   BUN 17 08/06/2013 0458   CREATININE 0.76 08/06/2013 0458   CALCIUM 9.4 08/06/2013 0458   PROT 8.0 08/04/2013 0853   ALBUMIN 3.9 08/04/2013 0853   AST 80* 08/04/2013 0853   ALT 64* 08/04/2013 0853   ALKPHOS 76 08/04/2013 0853   BILITOT 1.2 08/04/2013 0853   GFRNONAA 80* 08/06/2013 0458   GFRAA >90 08/06/2013 0458   Lipase     Component Value Date/Time   LIPASE 16 08/04/2013 0853       Studies/Results: Dg Abd 2 Views  08/08/2013   CLINICAL DATA:  Follow-up small bowel obstruction. Still having pain.  EXAM: ABDOMEN - 2 VIEW  COMPARISON:  08/07/2013  FINDINGS: Nasogastric tube is in place, tip overlying the level of the stomach. There are numerous significantly dilated small bowel loops which contain air-fluid levels on the erect view. There continues  to be paucity of large bowel gas. Findings are consistent with persistent small-bowel obstruction. No free intraperitoneal air.  IMPRESSION: Persistent small bowel obstruction.   Electronically Signed   By: Rosalie GumsBeth  Brown M.D.   On: 08/08/2013 08:31   Dg Abd 2 Views  08/07/2013   CLINICAL DATA:  Nausea, small bowel obstruction  EXAM: ABDOMEN - 2 VIEW  COMPARISON:  08/06/2013  FINDINGS: A nasogastric catheter remains within the stomach. Multiple dilated small bowel loops are noted. Air-fluid levels are again seen. No definitive free air is noted. The overall appearance is stable from the prior exam. Persistent atelectatic changes are noted in the bases bilaterally.  IMPRESSION: Stable small bowel obstruction.  Continued followup is recommended.   Electronically Signed   By: Alcide CleverMark  Lukens M.D.   On: 08/07/2013 08:17    Anti-infectives: Anti-infectives   Start     Dose/Rate Route Frequency Ordered Stop   08/08/13 1000  cefOXitin (MEFOXIN) 2 g in dextrose 5 % 50 mL IVPB     2 g 100 mL/hr over 30 Minutes Intravenous On call to O.R. 08/08/13 0946 08/09/13 0559       Assessment/Plan  1. SBO 2. Gluteal mass 3. HTN  Plan: 1. I have had a long d/w the patient  and her family about this situation.  Unfortunately, her x-rays and clinical picture or no better.  I have had an extensive conversation about surgery, the risks, complications, expected outcomes, and other options, etc.  They all understand and are agreeable to proceed with surgical intervention. 2. Pre op EKG, CXR, IV abx therapy on call to or.    LOS: 4 days    OSBORNE,KELLY E 08/08/2013, 9:56 AM Pager: [phone]

## 2013-08-08 NOTE — Interval H&P Note (Signed)
History and Physical Interval Note:  08/08/2013 10:17 AM  Bonnie PolioElla Roedl  has presented today for surgery, with the diagnosis of Small Bowel Obstruction  The various methods of treatment have been discussed with the patient and family. I reviewed the xrays with her and her daughter and these show no progression and she has not passed flatus.   After consideration of risks, benefits and other options for treatment, the patient has consented to  Procedure(s): EXPLORATORY LAPAROTOMY (N/A) as a surgical intervention .  The patient's history has been reviewed, patient examined, no change in status, stable for surgery.  I have reviewed the patient's chart and labs.  Questions were answered to the patient's satisfaction.     MARTIN,MATTHEW B

## 2013-08-08 NOTE — Progress Notes (Signed)
[facility] PROGRESS NOTE  Bonnie Briggs ZOX:[id number] DOB: 1934-04-26 DOA: 08/04/2013 PCP: No primary provider on file.  Brief narrative: Patient is a 79 year old with history of cesarean section many years ago. Her presenting to the hospital admission with abdominal discomfort and diagnosed with small bowel obstruction on imaging studies.    Assessment/Plan: Active Problems:   SBO (small bowel obstruction) -  S/p exploratory laparotomy with enterolysis. - Surgery following    HTN (hypertension) - given npo status holding oral antihypertensive medication - Labetalol 5 mg IV prn elevated blood pressure  Soft tissue mass left gluteus muscle - After improvement of principal problem we'll plan on obtaining MRI for further evaluation as recommended by the radiologist.  Code Status: Full code Family Communication: Discussed with patient and family members at bedside  Disposition Plan: Home when stable   Consultants:  General surgery  Procedures:  none  Antibiotics:  none  HPI/Subjective: Patient seen and examined, s/p surgery.  Objective: Filed Vitals:   08/08/13 1700  BP: 130/74  Pulse: 68  Temp: 97.4 F (36.3 C)  Resp: 12    Intake/Output Summary (Last 24 hours) at 08/08/13 1742 Last data filed at 08/08/13 1345  Gross per 24 hour  Intake 3593.33 ml  Output   1270 ml  Net 2323.33 ml   Filed Weights   08/07/13 0500  Weight: 81.647 kg (180 lb)    Exam:  Physical Exam: Head: Normocephalic, atraumatic.        Lungs: Normal respiratory effort. B/L Clear to auscultation, no crackles or wheezes.  Heart: Regular RR. S1 and S2 normal  Abdomen: BS normoactive. Soft, Nondistended, non-tender.  Extremities: No pretibial edema, no erythema   Data Reviewed: Basic Metabolic Panel:  Recent Labs Lab 08/04/13 0853 08/05/13 0520 08/06/13 0458 08/08/13 1529  NA 136* 140 140  --   K 4.0 4.3 3.9  --   CL 94* 97 98  --   CO2 26 31 31   --   GLUCOSE  168* 156* 153*  --   BUN 26* 23 17  --   CREATININE 0.78 0.77 0.76 0.76  CALCIUM 10.6* 9.4 9.4  --   MG 2.0  --   --   --   PHOS 4.6  --   --   --    Liver Function Tests:  Recent Labs Lab 08/04/13 0853  AST 80*  ALT 64*  ALKPHOS 76  BILITOT 1.2  PROT 8.0  ALBUMIN 3.9    Recent Labs Lab 08/04/13 0853  LIPASE 16   No results found for this basename: AMMONIA,  in the last 168 hours CBC:  Recent Labs Lab 08/04/13 0853 08/05/13 0520 08/06/13 0458 08/08/13 1529  WBC 7.9 5.6 5.7 8.8  NEUTROABS 6.3  --   --   --   HGB 16.1* 14.4 14.8 13.9  HCT 47.2* 44.7 46.1* 43.7  MCV 89.1 92.2 93.9 94.8  PLT 153 146* 126* 133*   Cardiac Enzymes: No results found for this basename: CKTOTAL, CKMB, CKMBINDEX, TROPONINI,  in the last 168 hours BNP (last 3 results) No results found for this basename: PROBNP,  in the last 8760 hours CBG:  Recent Labs Lab 08/07/13 2151 08/08/13 0728 08/08/13 1031 08/08/13 1256 08/08/13 1549  GLUCAP 121* 144* 131* 113* 127*    Recent Results (from the past 240 hour(s))  SURGICAL PCR SCREEN     Status: None   Collection Time    08/08/13  9:14 AM      Result  Value Ref Range Status   MRSA, PCR NEGATIVE  NEGATIVE Final   Staphylococcus aureus NEGATIVE  NEGATIVE Final   Comment:            The Xpert SA Assay (FDA     approved for NASAL specimens     in patients over 77 years of age),     is one component of     a comprehensive surveillance     program.  Test performance has     been validated by [facility] for patients greater     than or equal to 77 year old.     It is not intended     to diagnose infection nor to     guide or monitor treatment.     Studies: Dg Chest Port 1 View  08/08/2013   CLINICAL DATA:  Preoperative evaluation for bowel obstruction  EXAM: PORTABLE CHEST - 1 VIEW  COMPARISON:  None.  FINDINGS: There is patchy bibasilar atelectatic change. Lungs elsewhere are clear. Heart size and pulmonary vascularity are  normal. No adenopathy. Nasogastric tube tip and side port are in the stomach.  IMPRESSION: Patchy bibasilar atelectatic change. Lungs otherwise clear. Nasogastric tube tip and side port in stomach.   Electronically Signed   By: Bretta BangWilliam  Woodruff M.D.   On: 08/08/2013 10:27   Dg Abd 2 Views  08/08/2013   CLINICAL DATA:  Follow-up small bowel obstruction. Still having pain.  EXAM: ABDOMEN - 2 VIEW  COMPARISON:  08/07/2013  FINDINGS: Nasogastric tube is in place, tip overlying the level of the stomach. There are numerous significantly dilated small bowel loops which contain air-fluid levels on the erect view. There continues to be paucity of large bowel gas. Findings are consistent with persistent small-bowel obstruction. No free intraperitoneal air.  IMPRESSION: Persistent small bowel obstruction.   Electronically Signed   By: Rosalie GumsBeth  Brown M.D.   On: 08/08/2013 08:31   Dg Abd 2 Views  08/07/2013   CLINICAL DATA:  Nausea, small bowel obstruction  EXAM: ABDOMEN - 2 VIEW  COMPARISON:  08/06/2013  FINDINGS: A nasogastric catheter remains within the stomach. Multiple dilated small bowel loops are noted. Air-fluid levels are again seen. No definitive free air is noted. The overall appearance is stable from the prior exam. Persistent atelectatic changes are noted in the bases bilaterally.  IMPRESSION: Stable small bowel obstruction.  Continued followup is recommended.   Electronically Signed   By: Alcide CleverMark  Lukens M.D.   On: 08/07/2013 08:17    Scheduled Meds: . [START ON 08/09/2013] heparin  5,000 Units Subcutaneous 3 times per day  . HYDROmorphone      . insulin aspart  0-15 Units Subcutaneous 6 times per day   Continuous Infusions: . 0.45 % NaCl with KCl 20 mEq / L 100 mL/hr at 08/08/13 1706    Time spent: > 35 minutes    [facility] LPAMA,GAGAN S  [facility] Pager [phone] If 7PM-7AM, please contact night-coverage at [email or web address], password [email or web address] 08/08/2013, 5:42 PM  LOS: 4 days

## 2013-08-08 NOTE — OR Nursing (Signed)
While putting in foley Rn noted tissue prolapsed from vagina Dr notified and did a pelvic exam.

## 2013-08-08 NOTE — H&P (View-Only) (Signed)
Patient ID: Bonnie Briggs, female   DOB: 08/19/1934, 79 y.o.   MRN: [id number]  General Surgery - [facility] - Progress Note  Subjective: Patient in bed, family at bedside.  No pain.  Less distended. No BM or flatus.  Objective: Vital signs in last 24 hours: Temp:  [97.8 F (36.6 C)-98.4 F (36.9 C)] 97.8 F (36.6 C) (06/14 0545) Pulse Rate:  [70-99] 70 (06/14 0545) Resp:  [16-20] 16 (06/14 0545) BP: (139-167)/(84-96) 139/84 mmHg (06/14 0545) SpO2:  [92 %-95 %] 92 % (06/14 0545) Last BM Date: 07/31/13  Intake/Output from previous day: 06/13 0701 - 06/14 0700 In: 2567.5 [I.V.:1117.5; NG/GT:1450] Out: 800 [Urine:500; Emesis/NG output:300]  Exam: HEENT - clear, not icteric Neck - soft Chest - clear bilaterally Cor - RRR, no murmur Abd - soft, mild distension; mild upper abd tenderness; quiet; NG with bilious Ext - no significant edema Neuro - grossly intact, no focal deficits  Lab Results:   Recent Labs  08/04/13 0853 08/05/13 0520  WBC 7.9 5.6  HGB 16.1* 14.4  HCT 47.2* 44.7  PLT 153 146*     Recent Labs  08/04/13 0853 08/05/13 0520  NA 136* 140  K 4.0 4.3  CL 94* 97  CO2 26 31  GLUCOSE 168* 156*  BUN 26* 23  CREATININE 0.78 0.77  CALCIUM 10.6* 9.4    Studies/Results: Ct Abdomen Pelvis W Contrast  08/04/2013   CLINICAL DATA:  Pain.  Emesis.  EXAM: CT ABDOMEN AND PELVIS WITH CONTRAST  TECHNIQUE: Multidetector CT imaging of the abdomen and pelvis was performed using the standard protocol following bolus administration of intravenous contrast.  CONTRAST:  50mL OMNIPAQUE IOHEXOL 300 MG/ML SOLN, 100mL OMNIPAQUE IOHEXOL 300 MG/ML SOLN  COMPARISON:  None.  FINDINGS: Liver normal. Hepatic veins and portal vein patent. The spleen and splenic vein are normal. Pancreas normal. No biliary distention. The gallbladder is slightly distended. Multiple gallstones are present. No pericholecystic fluid collection or gallbladder wall thickening noted.   Adrenals normal. Tiny simple cysts and parapelvic cysts both kidneys. No hydronephrosis or obstructing calcified ureteral stones noted. Pelvic phleboliths. Bladder nondistended. No hydronephrosis. Calcified uterine fibroid. Adnexa unremarkable. Small amount of free pelvic fluid.  No significant adenopathy. Abdominal aorta is widely patent with atherosclerotic vascular changes. Visceral vessels are patent.  The appendix normal. Multiple dilated loops of small bowel are noted with distention up to 3.9 cm noted. These findings are consistent with small-bowel obstruction. This appears to be in the mid to distal jejunal region. Etiology of small bowel obstruction not identified. Decompressed loops of distal small bowel noted. Large amount stool is noted within the colon. The colon is nondistended. No free air. No pneumatosis. Again mesenteric vessels are patent. Mesenteric venous structures are patent. No mesenteric mass. No hernia.  Large fat containing lesion noted in the left gluteal musculature. This measures 9.9 x 5.6 cm. Septations are noted within this fat containing density. A small 1.1 cm nodule in the lower aspect of this fatty mass is noted. Although this finding may represent a benign lipoma a more aggressive lesion such as a liposarcoma cannot be excluded. Nonemergent MRI of the pelvis with gadolinium enhancement is suggested. Heart size normal. Moderate right sided pleural effusion noted. Bibasilar atelectasis and/or infiltrates present. Degenerative changes lumbar spine and both hips. Tiny sclerotic density in the right hip, most likely a benign bone island.  IMPRESSION: 1. Small bowel obstruction, most likely mid to distal jejunal. Etiology of obstruction is not identified. No   pneumatosis, free air, or portal venous air. Mesenteric vasculature patent. 2. Multiple gallstones. Gallbladder slightly distended. No gallbladder wall thickening or pericholecystic fluid collections. 3. 9.9 x 5.6 cm fatty mass  within the left gluteal musculature. This mass contains septations and a small 1.1 cm nodule in its inferior portion. Although this may represent a benign lipoma, a more aggressive lesion such as a liposarcoma cannot be excluded. Nonemergent gadolinium-enhanced MRI of the pelvis is suggested for further evaluation. 4. Small amount of free pelvic fluid. 5. Multiple calcified uterine fibroids. 6. Bibasilar atelectasis and/or infiltrates with moderate size right pleural effusion. These results were called by telephone at the time of interpretation on 08/04/2013 at 10:33 AM to Dr. Raeford RazorSTEPHEN KOHUT , who verbally acknowledged these results.   Electronically Signed   By: Maisie Fushomas  Register   On: 08/04/2013 10:35    Assessment / Plan: 1.  Small bowel obstruction, likely secondary to adhesions  Continue NPO, IV hydration - change IVF and rate  NG decompression - allow ice chips  Encouraged ambulation and IS use  AXR in AM 6/15  Labs in AM 6/15 2.  Soft tissue mass left gluteus muscle  Will require further evalution - likely MRI when able 3.  Cholelithiasis  Asymptomatic at present  Velora Hecklerodd M. Exzavier Ruderman, MD, [facility] Office: [phone]  08/05/2013

## 2013-08-08 NOTE — Transfer of Care (Signed)
Immediate Anesthesia Transfer of Care Note  Patient: Bonnie Briggs  Procedure(s) Performed: Procedure(s): EXPLORATORY LAPAROTOMY (N/A)  Patient Location: PACU  Anesthesia Type:General  Level of Consciousness: awake  Airway & Oxygen Therapy: Patient Spontanous Breathing and Patient connected to face mask oxygen  Post-op Assessment: Report given to PACU RN and Post -op Vital signs reviewed and stable  Post vital signs: Reviewed and stable  Complications: No apparent anesthesia complications

## 2013-08-08 NOTE — Op Note (Signed)
Surgeon: Wenda LowMatt Martin, MD, FACS  Asst:  none  Anes:  general  Procedure: Exploratory laparotomy with enterolysis  Diagnosis: Small bowel obstruction secondary to adhesive band.    Complications: none  EBL:   6 cc  Drains: none  Description of Procedure:  [facility]  After anesthesia was administered and the patient was prepped a timeout was performed.  A limited midline incision was made and the peritoneal cavity was entered without difficulty.  Serous fluid was noted.  Digital exam revealed a palpable band which was traced up and found to be omentum attached to the mesentery of the obstructed limb.  The proximal bowel was dilated and it was decompressed distally.  The band was divided and the obstructed area dilated and air and fluid flowed distally.  Bowel all appeared viable and no other abnormalities were noted.  The abdomen was then closed with interrupted #1 Novafil on the fascia and subcut 3-0 Vicryl and staples on the skin.    The patient tolerated the procedure well and was taken to the PACU in stable condition.     Matt B. Daphine DeutscherMartin, MD, [facility] [place] [phone]

## 2013-08-09 ENCOUNTER — Encounter (HOSPITAL_COMMUNITY): Payer: Self-pay | Admitting: Surgery

## 2013-08-09 LAB — CBC
HEMATOCRIT: 41.3 % (ref 36.0–46.0)
Hemoglobin: 13.2 g/dL (ref 12.0–15.0)
MCH: 30.1 pg (ref 26.0–34.0)
MCHC: 32 g/dL (ref 30.0–36.0)
MCV: 94.1 fL (ref 78.0–100.0)
PLATELETS: 140 10*3/uL — AB (ref 150–400)
RBC: 4.39 MIL/uL (ref 3.87–5.11)
RDW: 13.3 % (ref 11.5–15.5)
WBC: 8.3 10*3/uL (ref 4.0–10.5)

## 2013-08-09 LAB — BASIC METABOLIC PANEL
BUN: 16 mg/dL (ref 6–23)
CHLORIDE: 102 meq/L (ref 96–112)
CO2: 28 meq/L (ref 19–32)
Calcium: 8.2 mg/dL — ABNORMAL LOW (ref 8.4–10.5)
Creatinine, Ser: 0.8 mg/dL (ref 0.50–1.10)
GFR calc Af Amer: 81 mL/min — ABNORMAL LOW (ref >90)
GFR calc non Af Amer: 70 mL/min — ABNORMAL LOW (ref >90)
Glucose, Bld: 90 mg/dL (ref 70–99)
Potassium: 4.2 mEq/L (ref 3.7–5.3)
Sodium: 141 mEq/L (ref 137–147)

## 2013-08-09 LAB — GLUCOSE, CAPILLARY
GLUCOSE-CAPILLARY: 71 mg/dL (ref 70–99)
Glucose-Capillary: 99 mg/dL (ref 70–99)
Glucose-Capillary: 94 mg/dL (ref 70–99)
Glucose-Capillary: 85 mg/dL (ref 70–99)
Glucose-Capillary: 86 mg/dL (ref 70–99)
Glucose-Capillary: 79 mg/dL (ref 70–99)
Glucose-Capillary: 93 mg/dL (ref 70–99)

## 2013-08-09 MED ORDER — KCL IN DEXTROSE-NACL 20-5-0.45 MEQ/L-%-% IV SOLN
INTRAVENOUS | Status: DC
  Administered 2013-08-09 – 2013-08-12 (×5): via INTRAVENOUS
  Administered 2013-08-12: 100 mL/h via INTRAVENOUS
  Administered 2013-08-13 (×3): via INTRAVENOUS
  Filled 2013-08-09 (×13): qty 1000

## 2013-08-09 MED ORDER — ACETAMINOPHEN 325 MG PO TABS
650.0000 mg | ORAL_TABLET | ORAL | Status: DC | PRN
  Administered 2013-08-09: 650 mg via ORAL
  Administered 2013-08-11: 325 mg via ORAL
  Administered 2013-08-13 – 2013-08-14 (×2): 650 mg via ORAL
  Filled 2013-08-09 (×4): qty 2

## 2013-08-09 MED ORDER — SALINE SPRAY 0.65 % NA SOLN
1.0000 | NASAL | Status: DC | PRN
  Administered 2013-08-09: 1 via NASAL
  Filled 2013-08-09: qty 44

## 2013-08-09 MED ORDER — DORZOLAMIDE HCL-TIMOLOL MAL 2-0.5 % OP SOLN
1.0000 [drp] | Freq: Two times a day (BID) | OPHTHALMIC | Status: DC
Start: 2013-08-09 — End: 2013-08-15
  Administered 2013-08-09 – 2013-08-15 (×13): 1 [drp] via OPHTHALMIC
  Filled 2013-08-09: qty 10

## 2013-08-09 NOTE — Progress Notes (Signed)
Pt with gradual drop in cbg. Pt is npo. MD made aware. New order given with change in IV fluids. Vwilliams,rn.

## 2013-08-09 NOTE — Anesthesia Postprocedure Evaluation (Signed)
  Anesthesia Post-op Note  Patient: Bonnie Briggs  Procedure(s) Performed: Procedure(s) (LRB): EXPLORATORY LAPAROTOMY (N/A)  Patient Location: PACU  Anesthesia Type: General  Level of Consciousness: awake and alert   Airway and Oxygen Therapy: Patient Spontanous Breathing  Post-op Pain: mild  Post-op Assessment: Post-op Vital signs reviewed, Patient's Cardiovascular Status Stable, Respiratory Function Stable, Patent Airway and No signs of Nausea or vomiting  Last Vitals:  Filed Vitals:   08/09/13 1000  BP: 132/86  Pulse: 76  Temp: 36.9 C  Resp: 16    Post-op Vital Signs: stable   Complications: No apparent anesthesia complications

## 2013-08-09 NOTE — Progress Notes (Signed)
Patient urinary output has been low 225 for last 8 hours.  Donnamarie PoagK Kirby NP text paged for notification.  Await response.

## 2013-08-09 NOTE — Progress Notes (Signed)
Patient ID: Bonnie Briggs, female   DOB: [birth year], 79 y.o.   MRN: [id number] 1 Day Post-Op  Subjective: Patient feels ok today.  Some soreness.  No nausea, no flatus  Objective: Vital signs in last 24 hours: Temp:  [97.4 F (36.3 C)-98.4 F (36.9 C)] 98.4 F (36.9 C) (06/18 1000) Pulse Rate:  [65-85] 76 (06/18 1000) Resp:  [10-16] 16 (06/18 1000) BP: (114-138)/(66-86) 132/86 mmHg (06/18 1000) SpO2:  [93 %-100 %] 93 % (06/18 1000) Last BM Date: 08/05/13  Intake/Output from previous day: 06/17 0701 - 06/18 0700 In: 2540 [I.V.:2490; IV Piggyback:50] Out: 1245 [Urine:595; Emesis/NG output:600; Blood:50] Intake/Output this shift:    PE: Abd: soft, incision c/d/i with honeycomb dressing, mild distention  Lab Results:   Recent Labs  08/08/13 1529 08/09/13 0500  WBC 8.8 8.3  HGB 13.9 13.2  HCT 43.7 41.3  PLT 133* 140*   BMET  Recent Labs  08/08/13 1529 08/09/13 0500  NA  --  141  K  --  4.2  CL  --  102  CO2  --  28  GLUCOSE  --  90  BUN  --  16  CREATININE 0.76 0.80  CALCIUM  --  8.2*   PT/INR No results found for this basename: LABPROT, INR,  in the last 72 hours CMP     Component Value Date/Time   NA 141 08/09/2013 0500   K 4.2 08/09/2013 0500   CL 102 08/09/2013 0500   CO2 28 08/09/2013 0500   GLUCOSE 90 08/09/2013 0500   BUN 16 08/09/2013 0500   CREATININE 0.80 08/09/2013 0500   CALCIUM 8.2* 08/09/2013 0500   PROT 8.0 08/04/2013 0853   ALBUMIN 3.9 08/04/2013 0853   AST 80* 08/04/2013 0853   ALT 64* 08/04/2013 0853   ALKPHOS 76 08/04/2013 0853   BILITOT 1.2 08/04/2013 0853   GFRNONAA 70* 08/09/2013 0500   GFRAA 81* 08/09/2013 0500   Lipase     Component Value Date/Time   LIPASE 16 08/04/2013 0853       Studies/Results: Dg Chest Port 1 View  08/08/2013   CLINICAL DATA:  Preoperative evaluation for bowel obstruction  EXAM: PORTABLE CHEST - 1 VIEW  COMPARISON:  None.  FINDINGS: There is patchy bibasilar atelectatic change. Lungs elsewhere are clear. Heart  size and pulmonary vascularity are normal. No adenopathy. Nasogastric tube tip and side port are in the stomach.  IMPRESSION: Patchy bibasilar atelectatic change. Lungs otherwise clear. Nasogastric tube tip and side port in stomach.   Electronically Signed   By: Bretta BangWilliam  Woodruff M.D.   On: 08/08/2013 10:27   Dg Abd 2 Views  08/08/2013   CLINICAL DATA:  Follow-up small bowel obstruction. Still having pain.  EXAM: ABDOMEN - 2 VIEW  COMPARISON:  08/07/2013  FINDINGS: Nasogastric tube is in place, tip overlying the level of the stomach. There are numerous significantly dilated small bowel loops which contain air-fluid levels on the erect view. There continues to be paucity of large bowel gas. Findings are consistent with persistent small-bowel obstruction. No free intraperitoneal air.  IMPRESSION: Persistent small bowel obstruction.   Electronically Signed   By: Rosalie GumsBeth  Brown M.D.   On: 08/08/2013 08:31    Anti-infectives: Anti-infectives   Start     Dose/Rate Route Frequency Ordered Stop   08/08/13 1800  cefOXitin (MEFOXIN) 1 g in dextrose 5 % 50 mL IVPB     1 g 100 mL/hr over 30 Minutes Intravenous 4 times per day 08/08/13 1451 08/08/13  1736   08/08/13 1000  cefOXitin (MEFOXIN) 2 g in dextrose 5 % 50 mL IVPB     2 g 100 mL/hr over 30 Minutes Intravenous On call to O.R. 08/08/13 0946 08/08/13 1130       Assessment/Plan  1. POD 1, s/p ex lap with LOA 2. Post op ileus 3. Left gluteal mass 4. HTN  Plan: 1. Keep NPO and await bowel function 2. Mobilize and pulm toilet 3. Will need MRI with gadolinium when able to for gluteal mass    LOS: 5 days    OSBORNE,KELLY E 08/09/2013, 11:03 AM Pager: [phone]

## 2013-08-10 LAB — BASIC METABOLIC PANEL
BUN: 12 mg/dL (ref 6–23)
CHLORIDE: 100 meq/L (ref 96–112)
CO2: 24 meq/L (ref 19–32)
CREATININE: 0.67 mg/dL (ref 0.50–1.10)
Calcium: 8.9 mg/dL (ref 8.4–10.5)
GFR calc non Af Amer: 83 mL/min — ABNORMAL LOW (ref >90)
Glucose, Bld: 114 mg/dL — ABNORMAL HIGH (ref 70–99)
POTASSIUM: 4.4 meq/L (ref 3.7–5.3)
SODIUM: 137 meq/L (ref 137–147)

## 2013-08-10 LAB — CBC
HCT: 44.2 % (ref 36.0–46.0)
Hemoglobin: 14.4 g/dL (ref 12.0–15.0)
MCH: 30.2 pg (ref 26.0–34.0)
MCHC: 32.6 g/dL (ref 30.0–36.0)
MCV: 92.7 fL (ref 78.0–100.0)
Platelets: 148 10*3/uL — ABNORMAL LOW (ref 150–400)
RBC: 4.77 MIL/uL (ref 3.87–5.11)
RDW: 13.1 % (ref 11.5–15.5)
WBC: 7.8 10*3/uL (ref 4.0–10.5)

## 2013-08-10 LAB — GLUCOSE, CAPILLARY
GLUCOSE-CAPILLARY: 93 mg/dL (ref 70–99)
GLUCOSE-CAPILLARY: 108 mg/dL — AB (ref 70–99)
GLUCOSE-CAPILLARY: 114 mg/dL — AB (ref 70–99)
GLUCOSE-CAPILLARY: 120 mg/dL — AB (ref 70–99)
Glucose-Capillary: 137 mg/dL — ABNORMAL HIGH (ref 70–99)
Glucose-Capillary: 136 mg/dL — ABNORMAL HIGH (ref 70–99)

## 2013-08-10 MED ORDER — IRBESARTAN 150 MG PO TABS
150.0000 mg | ORAL_TABLET | Freq: Every day | ORAL | Status: DC
  Administered 2013-08-10 – 2013-08-15 (×6): 150 mg via ORAL
  Filled 2013-08-10 (×6): qty 1

## 2013-08-10 MED ORDER — DILTIAZEM HCL 60 MG PO TABS
120.0000 mg | ORAL_TABLET | Freq: Two times a day (BID) | ORAL | Status: DC
  Administered 2013-08-10 – 2013-08-14 (×9): 120 mg via ORAL
  Filled 2013-08-10 (×13): qty 2

## 2013-08-10 NOTE — Progress Notes (Signed)
Patient ID: Bonnie Briggs, female   DOB: [birth year], 79 y.o.   MRN: [id number] 2 Days Post-Op  Subjective: Pt had a rough night due to trouble with urine incontinence.  No flatus yet but feels gas pains  Objective: Vital signs in last 24 hours: Temp:  [97.8 F (36.6 C)-98.7 F (37.1 C)] 97.8 F (36.6 C) (06/19 0617) Pulse Rate:  [69-85] 84 (06/19 0617) Resp:  [14-16] 16 (06/19 0617) BP: (154-162)/(72-89) 162/83 mmHg (06/19 0617) SpO2:  [94 %-96 %] 96 % (06/19 0617) Last BM Date: 08/05/13  Intake/Output from previous day: 06/18 0701 - 06/19 0700 In: 2223.3 [P.O.:20; I.V.:2203.3] Out: 685 [Urine:685] Intake/Output this shift: Total I/O In: 0  Out: 250 [Urine:250]  PE: Abd: soft, appropriately tender, +BS, incision c/d/i with staples  Lab Results:   Recent Labs  08/09/13 0500 08/10/13 0428  WBC 8.3 7.8  HGB 13.2 14.4  HCT 41.3 44.2  PLT 140* 148*   BMET  Recent Labs  08/09/13 0500 08/10/13 0428  NA 141 137  K 4.2 4.4  CL 102 100  CO2 28 24  GLUCOSE 90 114*  BUN 16 12  CREATININE 0.80 0.67  CALCIUM 8.2* 8.9   PT/INR No results found for this basename: LABPROT, INR,  in the last 72 hours CMP     Component Value Date/Time   NA 137 08/10/2013 0428   K 4.4 08/10/2013 0428   CL 100 08/10/2013 0428   CO2 24 08/10/2013 0428   GLUCOSE 114* 08/10/2013 0428   BUN 12 08/10/2013 0428   CREATININE 0.67 08/10/2013 0428   CALCIUM 8.9 08/10/2013 0428   PROT 8.0 08/04/2013 0853   ALBUMIN 3.9 08/04/2013 0853   AST 80* 08/04/2013 0853   ALT 64* 08/04/2013 0853   ALKPHOS 76 08/04/2013 0853   BILITOT 1.2 08/04/2013 0853   GFRNONAA 83* 08/10/2013 0428   GFRAA >90 08/10/2013 0428   Lipase     Component Value Date/Time   LIPASE 16 08/04/2013 0853       Studies/Results: No results found.  Anti-infectives: Anti-infectives   Start     Dose/Rate Route Frequency Ordered Stop   08/08/13 1800  cefOXitin (MEFOXIN) 1 g in dextrose 5 % 50 mL IVPB     1 g 100 mL/hr over 30 Minutes  Intravenous 4 times per day 08/08/13 1451 08/08/13 1736   08/08/13 1000  cefOXitin (MEFOXIN) 2 g in dextrose 5 % 50 mL IVPB     2 g 100 mL/hr over 30 Minutes Intravenous On call to O.R. 08/08/13 0946 08/08/13 1130       Assessment/Plan  1. POD 2, s/p ex lap with LOA 2. HTN  Plan: 1. Will try clear liquids today 2. Restart HTN meds from home  LOS: 6 days    OSBORNE,KELLY E 08/10/2013, 11:07 AM Pager: [phone]

## 2013-08-10 NOTE — Care Management Note (Addendum)
    Page 1 of 1   08/13/2013     1:20:27 PM CARE MANAGEMENT NOTE 08/13/2013  Patient:  Ronna PolioGRAHAM,Genevra   Account Number:  [id number]  Date Initiated:  08/10/2013  Documentation initiated by:  Lanier ClamMAHABIR,KATHY  Subjective/Objective Assessment:   79 Y/O F ADMITTED W/SBO.     Action/Plan:   FROM HOME.   Anticipated DC Date:  08/16/2013   Anticipated DC Plan:  HOME/SELF CARE      DC Planning Services  CM consult      Choice offered to / List presented to:             Status of service:  Completed, signed off Medicare Important Message given?  YES (If response is "NO", the following Medicare IM given date fields will be blank) Date Medicare IM given:  08/14/2013 Date Additional Medicare IM given:    Discharge Disposition:  HOME/SELF CARE  Per UR Regulation:  Reviewed for med. necessity/level of care/duration of stay  If discussed at Long Length of Stay Meetings, dates discussed:    Comments:  08/10/13  KATHY MAHABIR RN,BSN NCM [phone] POD#2 LOA,NO FLATUS,ADVANCE TO CLEARS,CHEST CONGESTION, IVF @100 .D/C PLAN HOME.

## 2013-08-11 LAB — GLUCOSE, CAPILLARY
GLUCOSE-CAPILLARY: 113 mg/dL — AB (ref 70–99)
GLUCOSE-CAPILLARY: 171 mg/dL — AB (ref 70–99)
GLUCOSE-CAPILLARY: 142 mg/dL — AB (ref 70–99)
Glucose-Capillary: 151 mg/dL — ABNORMAL HIGH (ref 70–99)
Glucose-Capillary: 147 mg/dL — ABNORMAL HIGH (ref 70–99)

## 2013-08-11 MED ORDER — METOCLOPRAMIDE HCL 5 MG/ML IJ SOLN
5.0000 mg | Freq: Four times a day (QID) | INTRAMUSCULAR | Status: DC
  Administered 2013-08-11 – 2013-08-14 (×10): 5 mg via INTRAVENOUS
  Filled 2013-08-11 (×6): qty 1
  Filled 2013-08-11: qty 2
  Filled 2013-08-11 (×3): qty 1
  Filled 2013-08-11: qty 2
  Filled 2013-08-11 (×3): qty 1

## 2013-08-11 NOTE — Progress Notes (Signed)
Patient ID: Bonnie Briggs, female   DOB: October 07, 1934, 79 y.o.   MRN: [id number] [facility] Progress Note:   3 Days Post-Op  Subjective: Mental status is clear.  Sitting up in chair Objective: Vital signs in last 24 hours: Temp:  [98 F (36.7 C)-99 F (37.2 C)] 98 F (36.7 C) (06/20 0609) Pulse Rate:  [73-80] 79 (06/20 0609) Resp:  [14-17] 17 (06/20 0609) BP: (133-162)/(81-97) 162/97 mmHg (06/20 0609) SpO2:  [93 %-95 %] 94 % (06/20 0609)  Intake/Output from previous day: 06/19 0701 - 06/20 0700 In: 2655 [P.O.:330; I.V.:2325] Out: 2050 [Urine:2050] Intake/Output this shift:    Physical Exam: Work of breathing is normal.  Taking po.  No bm  Lab Results:  Results for orders placed during the hospital encounter of 08/04/13 (from the past 48 hour(s))  GLUCOSE, CAPILLARY     Status: None   Collection Time    08/09/13 11:14 AM      Result Value Ref Range   Glucose-Capillary 86  70 - 99 mg/dL  GLUCOSE, CAPILLARY     Status: None   Collection Time    08/09/13  3:31 PM      Result Value Ref Range   Glucose-Capillary 79  70 - 99 mg/dL  GLUCOSE, CAPILLARY     Status: None   Collection Time    08/09/13  7:23 PM      Result Value Ref Range   Glucose-Capillary 71  70 - 99 mg/dL  GLUCOSE, CAPILLARY     Status: None   Collection Time    08/09/13 11:22 PM      Result Value Ref Range   Glucose-Capillary 93  70 - 99 mg/dL  GLUCOSE, CAPILLARY     Status: None   Collection Time    08/10/13  2:42 AM      Result Value Ref Range   Glucose-Capillary 93  70 - 99 mg/dL  CBC     Status: Abnormal   Collection Time    08/10/13  4:28 AM      Result Value Ref Range   WBC 7.8  4.0 - 10.5 K/uL   RBC 4.77  3.87 - 5.11 MIL/uL   Hemoglobin 14.4  12.0 - 15.0 g/dL   HCT 44.2  36.0 - 46.0 %   MCV 92.7  78.0 - 100.0 fL   MCH 30.2  26.0 - 34.0 pg   MCHC 32.6  30.0 - 36.0 g/dL   RDW 13.1  11.5 - 15.5 %   Platelets 148 (*) 150 - 400 K/uL  BASIC METABOLIC PANEL     Status: Abnormal   Collection Time    08/10/13  4:28 AM      Result Value Ref Range   Sodium 137  137 - 147 mEq/L   Potassium 4.4  3.7 - 5.3 mEq/L   Chloride 100  96 - 112 mEq/L   CO2 24  19 - 32 mEq/L   Glucose, Bld 114 (*) 70 - 99 mg/dL   BUN 12  6 - 23 mg/dL   Creatinine, Ser 0.67  0.50 - 1.10 mg/dL   Calcium 8.9  8.4 - 10.5 mg/dL   GFR calc non Af Amer 83 (*) >90 mL/min   GFR calc Af Amer >90  >90 mL/min   Comment: (NOTE)     The eGFR has been calculated using the CKD EPI equation.     This calculation has not been validated in all clinical situations.     eGFR's  persistently <90 mL/min signify possible Chronic Kidney     Disease.  GLUCOSE, CAPILLARY     Status: Abnormal   Collection Time    08/10/13  7:37 AM      Result Value Ref Range   Glucose-Capillary 137 (*) 70 - 99 mg/dL  GLUCOSE, CAPILLARY     Status: Abnormal   Collection Time    08/10/13 11:32 AM      Result Value Ref Range   Glucose-Capillary 108 (*) 70 - 99 mg/dL  GLUCOSE, CAPILLARY     Status: Abnormal   Collection Time    08/10/13  3:36 PM      Result Value Ref Range   Glucose-Capillary 136 (*) 70 - 99 mg/dL  GLUCOSE, CAPILLARY     Status: Abnormal   Collection Time    08/10/13  7:49 PM      Result Value Ref Range   Glucose-Capillary 114 (*) 70 - 99 mg/dL  GLUCOSE, CAPILLARY     Status: Abnormal   Collection Time    08/10/13 11:20 PM      Result Value Ref Range   Glucose-Capillary 120 (*) 70 - 99 mg/dL  GLUCOSE, CAPILLARY     Status: Abnormal   Collection Time    08/11/13  3:44 AM      Result Value Ref Range   Glucose-Capillary 113 (*) 70 - 99 mg/dL  GLUCOSE, CAPILLARY     Status: Abnormal   Collection Time    08/11/13  7:49 AM      Result Value Ref Range   Glucose-Capillary 151 (*) 70 - 99 mg/dL    Radiology/Results: No results found.  Anti-infectives: Anti-infectives   Start     Dose/Rate Route Frequency Ordered Stop   08/08/13 1800  cefOXitin (MEFOXIN) 1 g in dextrose 5 % 50 mL IVPB     1 g 100 mL/hr  over 30 Minutes Intravenous 4 times per day 08/08/13 1451 08/08/13 1736   08/08/13 1000  cefOXitin (MEFOXIN) 2 g in dextrose 5 % 50 mL IVPB     2 g 100 mL/hr over 30 Minutes Intravenous On call to O.R. 08/08/13 0946 08/08/13 1130      Assessment/Plan: Problem List: Patient Active Problem List   Diagnosis Date Noted  . S/P exploratory laparotomyJune2015 08/08/2013  . SBO (small bowel obstruction) 08/04/2013  . HTN (hypertension) 08/04/2013    Awaiting resolution of ileus after enterolysis 3 Days Post-Op    LOS: 7 days   Matt B. Hassell Done, MD, [facility] [phone] beeper [phone]  08/11/2013 8:21 AM

## 2013-08-11 NOTE — Progress Notes (Signed)
Vomited moderate amount brownish green liquid. Prn for nausea given . Encouraged to sip only until nausea passes and begins passing flatus. Encouraged  to walk .

## 2013-08-12 ENCOUNTER — Inpatient Hospital Stay (HOSPITAL_COMMUNITY): Payer: Medicare (Managed Care)

## 2013-08-12 LAB — CBC WITH DIFFERENTIAL/PLATELET
BASOS PCT: 0 % (ref 0–1)
Basophils Absolute: 0 10*3/uL (ref 0.0–0.1)
EOS PCT: 1 % (ref 0–5)
Eosinophils Absolute: 0.1 10*3/uL (ref 0.0–0.7)
HCT: 45.7 % (ref 36.0–46.0)
HEMOGLOBIN: 15.2 g/dL — AB (ref 12.0–15.0)
LYMPHS PCT: 11 % — AB (ref 12–46)
Lymphs Abs: 0.9 10*3/uL (ref 0.7–4.0)
MCH: 30 pg (ref 26.0–34.0)
MCHC: 33.3 g/dL (ref 30.0–36.0)
MCV: 90.1 fL (ref 78.0–100.0)
MONO ABS: 0.9 10*3/uL (ref 0.1–1.0)
Monocytes Relative: 11 % (ref 3–12)
Neutro Abs: 6 10*3/uL (ref 1.7–7.7)
Neutrophils Relative %: 77 % (ref 43–77)
PLATELETS: 170 10*3/uL (ref 150–400)
RBC: 5.07 MIL/uL (ref 3.87–5.11)
RDW: 13.4 % (ref 11.5–15.5)
WBC: 7.9 10*3/uL (ref 4.0–10.5)

## 2013-08-12 LAB — BASIC METABOLIC PANEL
BUN: 9 mg/dL (ref 6–23)
CHLORIDE: 95 meq/L — AB (ref 96–112)
CO2: 26 meq/L (ref 19–32)
CREATININE: 0.72 mg/dL (ref 0.50–1.10)
Calcium: 9.2 mg/dL (ref 8.4–10.5)
GFR calc Af Amer: >90 mL/min (ref >90)
GFR calc non Af Amer: 81 mL/min — ABNORMAL LOW (ref >90)
Glucose, Bld: 172 mg/dL — ABNORMAL HIGH (ref 70–99)
POTASSIUM: 4.6 meq/L (ref 3.7–5.3)
Sodium: 134 mEq/L — ABNORMAL LOW (ref 137–147)

## 2013-08-12 LAB — GLUCOSE, CAPILLARY
GLUCOSE-CAPILLARY: 125 mg/dL — AB (ref 70–99)
GLUCOSE-CAPILLARY: 140 mg/dL — AB (ref 70–99)
GLUCOSE-CAPILLARY: 122 mg/dL — AB (ref 70–99)
Glucose-Capillary: 139 mg/dL — ABNORMAL HIGH (ref 70–99)
Glucose-Capillary: 161 mg/dL — ABNORMAL HIGH (ref 70–99)
Glucose-Capillary: 165 mg/dL — ABNORMAL HIGH (ref 70–99)

## 2013-08-12 MED ORDER — BISACODYL 10 MG RE SUPP
10.0000 mg | Freq: Once | RECTAL | Status: AC
  Administered 2013-08-12: 10 mg via RECTAL
  Filled 2013-08-12: qty 1

## 2013-08-12 MED ORDER — SORBITOL 70 % SOLN
960.0000 mL | TOPICAL_OIL | Freq: Once | ORAL | Status: AC
  Administered 2013-08-12: 960 mL via RECTAL
  Filled 2013-08-12: qty 240

## 2013-08-12 NOTE — Progress Notes (Signed)
Patient ID: Bonnie Briggs, female   DOB: 01-12-1935, 79 y.o.   MRN: [id number] [facility] Progress [facility]:   4 Days Post-Op  Subjective: Mental status is clear.  Patent is sitting up rocking in chair.  Nauseated last night with vomiting.  Did not want NG tube.   Objective: Vital signs in last 24 hours: Temp:  [98.2 F (36.8 C)-98.8 F (37.1 C)] 98.3 F (36.8 C) (06/21 0529) Pulse Rate:  [71-99] 99 (06/21 0529) Resp:  [18] 18 (06/21 0529) BP: (127-144)/(85-88) 132/88 mmHg (06/21 0529) SpO2:  [94 %-96 %] 95 % (06/21 0529)  Intake/Output from previous day: 06/20 0701 - 06/21 0700 In: 3166.7 [P.O.:720; I.V.:2446.7] Out: 1850 [Urine:1300; Emesis/NG output:550] Intake/Output this shift:    Physical Exam: Work of breathing is not increased.  Abdomen is soft and nondistended.  No flautus this am.    Lab Results:  Results for orders placed during the hospital encounter of 08/04/13 (from the past 48 hour(s))  GLUCOSE, CAPILLARY     Status: Abnormal   Collection Time    08/10/13 11:32 AM      Result Value Ref Range   Glucose-Capillary 108 (*) 70 - 99 mg/dL  GLUCOSE, CAPILLARY     Status: Abnormal   Collection Time    08/10/13  3:36 PM      Result Value Ref Range   Glucose-Capillary 136 (*) 70 - 99 mg/dL  GLUCOSE, CAPILLARY     Status: Abnormal   Collection Time    08/10/13  7:49 PM      Result Value Ref Range   Glucose-Capillary 114 (*) 70 - 99 mg/dL  GLUCOSE, CAPILLARY     Status: Abnormal   Collection Time    08/10/13 11:20 PM      Result Value Ref Range   Glucose-Capillary 120 (*) 70 - 99 mg/dL  GLUCOSE, CAPILLARY     Status: Abnormal   Collection Time    08/11/13  3:44 AM      Result Value Ref Range   Glucose-Capillary 113 (*) 70 - 99 mg/dL  GLUCOSE, CAPILLARY     Status: Abnormal   Collection Time    08/11/13  7:49 AM      Result Value Ref Range   Glucose-Capillary 151 (*) 70 - 99 mg/dL  GLUCOSE, CAPILLARY     Status: Abnormal   Collection Time    08/11/13  12:15 PM      Result Value Ref Range   Glucose-Capillary 171 (*) 70 - 99 mg/dL  GLUCOSE, CAPILLARY     Status: Abnormal   Collection Time    08/11/13  3:56 PM      Result Value Ref Range   Glucose-Capillary 142 (*) 70 - 99 mg/dL  GLUCOSE, CAPILLARY     Status: Abnormal   Collection Time    08/11/13  8:02 PM      Result Value Ref Range   Glucose-Capillary 147 (*) 70 - 99 mg/dL  GLUCOSE, CAPILLARY     Status: Abnormal   Collection Time    08/12/13 12:02 AM      Result Value Ref Range   Glucose-Capillary 139 (*) 70 - 99 mg/dL  GLUCOSE, CAPILLARY     Status: Abnormal   Collection Time    08/12/13  4:11 AM      Result Value Ref Range   Glucose-Capillary 161 (*) 70 - 99 mg/dL  GLUCOSE, CAPILLARY     Status: Abnormal   Collection Time    08/12/13  7:47 AM      Result Value Ref Range   Glucose-Capillary 125 (*) 70 - 99 mg/dL    Radiology/Results: No results found.  Anti-infectives: Anti-infectives   Start     Dose/Rate Route Frequency Ordered Stop   08/08/13 1800  cefOXitin (MEFOXIN) 1 g in dextrose 5 % 50 mL IVPB     1 g 100 mL/hr over 30 Minutes Intravenous 4 times per day 08/08/13 1451 08/08/13 1736   08/08/13 1000  cefOXitin (MEFOXIN) 2 g in dextrose 5 % 50 mL IVPB     2 g 100 mL/hr over 30 Minutes Intravenous On call to O.R. 08/08/13 0946 08/08/13 1130      Assessment/Plan: Problem List: Patient Active Problem List   Diagnosis Date Noted  . S/P exploratory laparotomyJune2015 08/08/2013  . SBO (small bowel obstruction) 08/04/2013  . HTN (hypertension) 08/04/2013    Ileus after enterolysis.  Will check potassium.  Xray this am shows dilated loops of small bowel with small amount of gas in colon.  Will give Dulcolax supp and enema today.  Reglan begun last night.  Xray ordered for AM 4 Days Post-Op    LOS: 8 days   Matt B. Daphine DeutscherMartin, MD, [facility]  [facility] [phone] beeper [phone]  08/12/2013 9:43 AM

## 2013-08-12 NOTE — Progress Notes (Signed)
Pt vomited medium amount of green bile. Pt does not want a NG tube at this time. MD notified. Orders given. Encouraged pt to walk and sit in rocking chair. Will continue to monitor pt. Bonnie Briggs, Bonnie Briggs

## 2013-08-13 ENCOUNTER — Inpatient Hospital Stay (HOSPITAL_COMMUNITY): Payer: Medicare (Managed Care)

## 2013-08-13 LAB — CBC WITH DIFFERENTIAL/PLATELET
BASOS PCT: 0 % (ref 0–1)
Basophils Absolute: 0 10*3/uL (ref 0.0–0.1)
EOS ABS: 0.2 10*3/uL (ref 0.0–0.7)
Eosinophils Relative: 2 % (ref 0–5)
HCT: 41.7 % (ref 36.0–46.0)
HEMOGLOBIN: 13.5 g/dL (ref 12.0–15.0)
LYMPHS ABS: 2.1 10*3/uL (ref 0.7–4.0)
Lymphocytes Relative: 25 % (ref 12–46)
MCH: 30.1 pg (ref 26.0–34.0)
MCHC: 32.4 g/dL (ref 30.0–36.0)
MCV: 92.9 fL (ref 78.0–100.0)
MONOS PCT: 10 % (ref 3–12)
Monocytes Absolute: 0.9 10*3/uL (ref 0.1–1.0)
Neutro Abs: 5.4 10*3/uL (ref 1.7–7.7)
Neutrophils Relative %: 63 % (ref 43–77)
PLATELETS: 171 10*3/uL (ref 150–400)
RBC: 4.49 MIL/uL (ref 3.87–5.11)
RDW: 13.8 % (ref 11.5–15.5)
WBC: 8.6 10*3/uL (ref 4.0–10.5)

## 2013-08-13 LAB — GLUCOSE, CAPILLARY
GLUCOSE-CAPILLARY: 106 mg/dL — AB (ref 70–99)
GLUCOSE-CAPILLARY: 134 mg/dL — AB (ref 70–99)
Glucose-Capillary: 117 mg/dL — ABNORMAL HIGH (ref 70–99)
Glucose-Capillary: 141 mg/dL — ABNORMAL HIGH (ref 70–99)
Glucose-Capillary: 119 mg/dL — ABNORMAL HIGH (ref 70–99)
Glucose-Capillary: 86 mg/dL (ref 70–99)
Glucose-Capillary: 114 mg/dL — ABNORMAL HIGH (ref 70–99)

## 2013-08-13 NOTE — Progress Notes (Signed)
5 Days Post-Op  Subjective: Feeling somewhat better. Minimal emesis last 24 hours.  2-3 stools with flatus after enemas yesterday. Not much pain. Ambulating frequently.  Abdominal x-rays this morning looked more like ileus than SBO. There is an air-fluid level in the stomach. There is some dilated small bowel. There is stool and air in the right colon.  WBC 8600 with normal differential. Hemoglobin 13.5. B-met normal yesterday. Objective: Vital signs in last 24 hours: Temp:  [98.1 F (36.7 C)-98.7 F (37.1 C)] 98.1 F (36.7 C) (06/22 0606) Pulse Rate:  [71-97] 71 (06/22 0606) Resp:  [14-18] 14 (06/22 0606) BP: (112-133)/(72-83) 112/72 mmHg (06/22 0606) SpO2:  [95 %-96 %] 95 % (06/22 0606) Last BM Date: 08/12/13  Intake/Output from previous day: 06/21 0701 - 06/22 0700 In: 2771.7 [P.O.:360; I.V.:2411.7] Out: 500 [Urine:500] Intake/Output this shift:    General appearance: alert. Pleasant. Mental status normal. Family in room. No distress. Resp: clear to auscultation bilaterally GI: abdomen is soft and fairly benign. Borderline distended  with borderline tympany. Minimal bowel sounds. Midline wound examined and is clean.  Lab Results:  Results for orders placed during the hospital encounter of 08/04/13 (from the past 24 hour(s))  BASIC METABOLIC PANEL     Status: Abnormal   Collection Time    08/12/13 10:25 AM      Result Value Ref Range   Sodium 134 (*) 137 - 147 mEq/L   Potassium 4.6  3.7 - 5.3 mEq/L   Chloride 95 (*) 96 - 112 mEq/L   CO2 26  19 - 32 mEq/L   Glucose, Bld 172 (*) 70 - 99 mg/dL   BUN 9  6 - 23 mg/dL   Creatinine, Ser 0.72  0.50 - 1.10 mg/dL   Calcium 9.2  8.4 - 10.5 mg/dL   GFR calc non Af Amer 81 (*) >90 mL/min   GFR calc Af Amer >90  >90 mL/min  CBC WITH DIFFERENTIAL     Status: Abnormal   Collection Time    08/12/13 10:25 AM      Result Value Ref Range   WBC 7.9  4.0 - 10.5 K/uL   RBC 5.07  3.87 - 5.11 MIL/uL   Hemoglobin 15.2 (*) 12.0 - 15.0 g/dL    HCT 45.7  36.0 - 46.0 %   MCV 90.1  78.0 - 100.0 fL   MCH 30.0  26.0 - 34.0 pg   MCHC 33.3  30.0 - 36.0 g/dL   RDW 13.4  11.5 - 15.5 %   Platelets 170  150 - 400 K/uL   Neutrophils Relative % 77  43 - 77 %   Lymphocytes Relative 11 (*) 12 - 46 %   Monocytes Relative 11  3 - 12 %   Eosinophils Relative 1  0 - 5 %   Basophils Relative 0  0 - 1 %   Neutro Abs 6.0  1.7 - 7.7 K/uL   Lymphs Abs 0.9  0.7 - 4.0 K/uL   Monocytes Absolute 0.9  0.1 - 1.0 K/uL   Eosinophils Absolute 0.1  0.0 - 0.7 K/uL   Basophils Absolute 0.0  0.0 - 0.1 K/uL   Smear Review PLATELET COUNT CONFIRMED BY SMEAR    GLUCOSE, CAPILLARY     Status: Abnormal   Collection Time    08/12/13 12:07 PM      Result Value Ref Range   Glucose-Capillary 165 (*) 70 - 99 mg/dL  GLUCOSE, CAPILLARY     Status: Abnormal  Collection Time    09/10/2013  4:25 PM      Result Value Ref Range   Glucose-Capillary 140 (*) 70 - 99 mg/dL  GLUCOSE, CAPILLARY     Status: Abnormal   Collection Time    09-10-2013  8:17 PM      Result Value Ref Range   Glucose-Capillary 122 (*) 70 - 99 mg/dL   Comment 1 Notify RN    GLUCOSE, CAPILLARY     Status: Abnormal   Collection Time    08/13/13 12:11 AM      Result Value Ref Range   Glucose-Capillary 106 (*) 70 - 99 mg/dL   Comment 1 Notify RN    GLUCOSE, CAPILLARY     Status: Abnormal   Collection Time    08/13/13  4:05 AM      Result Value Ref Range   Glucose-Capillary 117 (*) 70 - 99 mg/dL   Comment 1 Notify RN    CBC WITH DIFFERENTIAL     Status: None   Collection Time    08/13/13  4:53 AM      Result Value Ref Range   WBC 8.6  4.0 - 10.5 K/uL   RBC 4.49  3.87 - 5.11 MIL/uL   Hemoglobin 13.5  12.0 - 15.0 g/dL   HCT 41.7  36.0 - 46.0 %   MCV 92.9  78.0 - 100.0 fL   MCH 30.1  26.0 - 34.0 pg   MCHC 32.4  30.0 - 36.0 g/dL   RDW 13.8  11.5 - 15.5 %   Platelets 171  150 - 400 K/uL   Neutrophils Relative % 63  43 - 77 %   Neutro Abs 5.4  1.7 - 7.7 K/uL   Lymphocytes Relative 25  12 - 46  %   Lymphs Abs 2.1  0.7 - 4.0 K/uL   Monocytes Relative 10  3 - 12 %   Monocytes Absolute 0.9  0.1 - 1.0 K/uL   Eosinophils Relative 2  0 - 5 %   Eosinophils Absolute 0.2  0.0 - 0.7 K/uL   Basophils Relative 0  0 - 1 %   Basophils Absolute 0.0  0.0 - 0.1 K/uL  GLUCOSE, CAPILLARY     Status: Abnormal   Collection Time    08/13/13  7:26 AM      Result Value Ref Range   Glucose-Capillary 141 (*) 70 - 99 mg/dL     Studies/Results: Dg Abd 2 Views  10-Sep-2013   CLINICAL DATA:  Vomiting  EXAM: ABDOMEN - 2 VIEW  COMPARISON:  08/08/2013  FINDINGS: Small bowel dilatation remains but is improved. There are multiple small bowel air-fluid levels consistent with small bowel obstruction. There is now some gas in the right colon.  NG tube has been removed. No free air in the peritoneal cavity. Interval laparotomy.  IMPRESSION: Improving small bowel obstruction pattern.   Electronically Signed   By: Franchot Gallo M.D.   On: 2013/09/10 10:14    . diltiazem  120 mg Oral BID  . dorzolamide-timolol  1 drop Both Eyes BID  . heparin  5,000 Units Subcutaneous 3 times per day  . insulin aspart  0-15 Units Subcutaneous 6 times per day  . irbesartan  150 mg Oral Daily  . metoCLOPramide (REGLAN) injection  5 mg Intravenous 4 times per day     Assessment/Plan: s/p Procedure(s): EXPLORATORY LAPAROTOMY  POD #5. Mini laparotomy with lysis of singleband for SBO Clinical and radiographic workup today is more  suggestive of ileus than recurrent SBO.  I discussed this with the patient and her family Clear liquids, only, as tolerated Increase ambulation Decrease narcotics Check lab work tomorrow. Hopefully this will resolve spontaneously in a couple of days.    @PROBHOSP @  LOS: 9 days    INGRAM,HAYWOOD M 08/13/2013  . .prob

## 2013-08-14 LAB — CBC
HEMATOCRIT: 40.3 % (ref 36.0–46.0)
HEMOGLOBIN: 13.2 g/dL (ref 12.0–15.0)
MCH: 29.9 pg (ref 26.0–34.0)
MCHC: 32.8 g/dL (ref 30.0–36.0)
MCV: 91.2 fL (ref 78.0–100.0)
Platelets: 179 10*3/uL (ref 150–400)
RBC: 4.42 MIL/uL (ref 3.87–5.11)
RDW: 13.8 % (ref 11.5–15.5)
WBC: 6.9 10*3/uL (ref 4.0–10.5)

## 2013-08-14 LAB — BASIC METABOLIC PANEL
BUN: 5 mg/dL — ABNORMAL LOW (ref 6–23)
CHLORIDE: 104 meq/L (ref 96–112)
CO2: 24 meq/L (ref 19–32)
Calcium: 8.9 mg/dL (ref 8.4–10.5)
Creatinine, Ser: 0.78 mg/dL (ref 0.50–1.10)
GFR calc Af Amer: >90 mL/min (ref >90)
GFR calc non Af Amer: 79 mL/min — ABNORMAL LOW (ref >90)
GLUCOSE: 120 mg/dL — AB (ref 70–99)
POTASSIUM: 4.6 meq/L (ref 3.7–5.3)
SODIUM: 139 meq/L (ref 137–147)

## 2013-08-14 LAB — GLUCOSE, CAPILLARY
GLUCOSE-CAPILLARY: 125 mg/dL — AB (ref 70–99)
GLUCOSE-CAPILLARY: 114 mg/dL — AB (ref 70–99)
Glucose-Capillary: 109 mg/dL — ABNORMAL HIGH (ref 70–99)
Glucose-Capillary: 110 mg/dL — ABNORMAL HIGH (ref 70–99)
Glucose-Capillary: 120 mg/dL — ABNORMAL HIGH (ref 70–99)

## 2013-08-14 MED ORDER — INSULIN ASPART 100 UNIT/ML ~~LOC~~ SOLN
0.0000 [IU] | Freq: Three times a day (TID) | SUBCUTANEOUS | Status: DC
  Administered 2013-08-15: 2 [IU] via SUBCUTANEOUS

## 2013-08-14 MED ORDER — TRAMADOL HCL 50 MG PO TABS: 50.0000 mg | ORAL_TABLET | Freq: Four times a day (QID) | ORAL | Status: DC | PRN

## 2013-08-14 MED ORDER — HYDROCODONE-ACETAMINOPHEN 5-325 MG PO TABS: 1.0000 | ORAL_TABLET | Freq: Four times a day (QID) | ORAL | Status: DC | PRN

## 2013-08-14 MED ORDER — SODIUM CHLORIDE 0.9 % IJ SOLN: 3.0000 mL | INTRAMUSCULAR | Status: DC | PRN

## 2013-08-14 MED ORDER — SODIUM CHLORIDE 0.9 % IV SOLN: 250.0000 mL | INTRAVENOUS | Status: DC | PRN

## 2013-08-14 MED ORDER — MORPHINE SULFATE 2 MG/ML IJ SOLN: 1.0000 mg | INTRAMUSCULAR | Status: DC | PRN

## 2013-08-14 MED ORDER — SODIUM CHLORIDE 0.9 % IJ SOLN
3.0000 mL | Freq: Two times a day (BID) | INTRAMUSCULAR | Status: DC
  Administered 2013-08-14 (×2): 3 mL via INTRAVENOUS

## 2013-08-14 MED ORDER — METOCLOPRAMIDE HCL 5 MG/ML IJ SOLN
5.0000 mg | Freq: Three times a day (TID) | INTRAMUSCULAR | Status: DC
  Administered 2013-08-14 (×2): 5 mg via INTRAVENOUS
  Filled 2013-08-14 (×2): qty 1
  Filled 2013-08-14: qty 2
  Filled 2013-08-14: qty 1

## 2013-08-14 NOTE — Progress Notes (Signed)
Patient ID: Bonnie Briggs, female   DOB: 04-14-1934, 79 y.o.   MRN: [id number]  Subjective: Had a large BM this AM, one yesterday.  Denies nausea or vomiting.  Tolerating clears, hungry.  Sitting up in chair, walking 5x per day in hallways.  Denies cp, sob, palpitations.    Objective:  Vital signs:  Filed Vitals:   08/13/13 0606 08/13/13 1353 08/13/13 2131 08/14/13 0505  BP: 112/72 125/69 149/75 143/80  Pulse: 71 64 66 74  Temp: 98.1 F (36.7 C) 98.2 F (36.8 C) 98.3 F (36.8 C) 98.6 F (37 C)  TempSrc: Oral Oral Oral Oral  Resp: _0 Height:      Weight:      SpO2: 95% 97% 96% 95%    Last BM Date: 08/13/13  Intake/Output   Yesterday:  06/22 0701 - 06/23 0700 In: 3048.3 [P.O.:660; I.V.:2388.3] Out: -  This shift:    I/O last 3 completed shifts: In: 0938 [P.O.:660; I.V.:3615] Out: -     Physical Exam: General: Pt awake/alert/oriented x4 in no acute distress Chest: cta. No chest wall pain w good excursion CV:  Pulses intact.  Regular rhythm MS: Normal AROM mjr joints.  No obvious deformity Abdomen: Soft.  Nondistended. appropriately tender.  Midline incision-staples in place, edges are approximated, no erythema or drainage.   No evidence of peritonitis.  No incarcerated hernias. Ext:  SCDs BLE.  No mjr edema.  No cyanosis Skin: No petechiae / purpura   Problem List:   Active Problems:   SBO (small bowel obstruction)   HTN (hypertension)   S/P exploratory laparotomyJune2015    Results:   Labs: Results for orders placed during the hospital encounter of 08/04/13 (from the past 48 hour(s))  BASIC METABOLIC PANEL     Status: Abnormal   Collection Time    08/12/13 10:25 AM      Result Value Ref Range   Sodium 134 (*) 137 - 147 mEq/L   Potassium 4.6  3.7 - 5.3 mEq/L   Chloride 95 (*) 96 - 112 mEq/L   CO2 26  19 - 32 mEq/L   Glucose, Bld 172 (*) 70 - 99 mg/dL   BUN 9  6 - 23 mg/dL   Creatinine, Ser 0.72  0.50 - 1.10 mg/dL   Calcium 9.2  8.4 - 10.5  mg/dL   GFR calc non Af Amer 81 (*) >90 mL/min   GFR calc Af Amer >90  >90 mL/min   Comment: (NOTE)     The eGFR has been calculated using the CKD EPI equation.     This calculation has not been validated in all clinical situations.     eGFR's persistently <90 mL/min signify possible Chronic Kidney     Disease.  CBC WITH DIFFERENTIAL     Status: Abnormal   Collection Time    08/12/13 10:25 AM      Result Value Ref Range   WBC 7.9  4.0 - 10.5 K/uL   RBC 5.07  3.87 - 5.11 MIL/uL   Hemoglobin 15.2 (*) 12.0 - 15.0 g/dL   HCT 45.7  36.0 - 46.0 %   MCV 90.1  78.0 - 100.0 fL   MCH 30.0  26.0 - 34.0 pg   MCHC 33.3  30.0 - 36.0 g/dL   RDW 13.4  11.5 - 15.5 %   Platelets 170  150 - 400 K/uL   Comment: RESULT REPEATED AND VERIFIED     SPECIMEN CHECKED FOR CLOTS  Neutrophils Relative % 77  43 - 77 %   Lymphocytes Relative 11 (*) 12 - 46 %   Monocytes Relative 11  3 - 12 %   Eosinophils Relative 1  0 - 5 %   Basophils Relative 0  0 - 1 %   Neutro Abs 6.0  1.7 - 7.7 K/uL   Lymphs Abs 0.9  0.7 - 4.0 K/uL   Monocytes Absolute 0.9  0.1 - 1.0 K/uL   Eosinophils Absolute 0.1  0.0 - 0.7 K/uL   Basophils Absolute 0.0  0.0 - 0.1 K/uL   Smear Review PLATELET COUNT CONFIRMED BY SMEAR    GLUCOSE, CAPILLARY     Status: Abnormal   Collection Time    08/12/13 12:07 PM      Result Value Ref Range   Glucose-Capillary 165 (*) 70 - 99 mg/dL  GLUCOSE, CAPILLARY     Status: Abnormal   Collection Time    08/12/13  4:25 PM      Result Value Ref Range   Glucose-Capillary 140 (*) 70 - 99 mg/dL  GLUCOSE, CAPILLARY     Status: Abnormal   Collection Time    08/12/13  8:17 PM      Result Value Ref Range   Glucose-Capillary 122 (*) 70 - 99 mg/dL   Comment 1 Notify RN    GLUCOSE, CAPILLARY     Status: Abnormal   Collection Time    08/13/13 12:11 AM      Result Value Ref Range   Glucose-Capillary 106 (*) 70 - 99 mg/dL   Comment 1 Notify RN    GLUCOSE, CAPILLARY     Status: Abnormal   Collection Time     08/13/13  4:05 AM      Result Value Ref Range   Glucose-Capillary 117 (*) 70 - 99 mg/dL   Comment 1 Notify RN    CBC WITH DIFFERENTIAL     Status: None   Collection Time    08/13/13  4:53 AM      Result Value Ref Range   WBC 8.6  4.0 - 10.5 K/uL   RBC 4.49  3.87 - 5.11 MIL/uL   Hemoglobin 13.5  12.0 - 15.0 g/dL   HCT 41.7  36.0 - 46.0 %   MCV 92.9  78.0 - 100.0 fL   MCH 30.1  26.0 - 34.0 pg   MCHC 32.4  30.0 - 36.0 g/dL   RDW 13.8  11.5 - 15.5 %   Platelets 171  150 - 400 K/uL   Neutrophils Relative % 63  43 - 77 %   Neutro Abs 5.4  1.7 - 7.7 K/uL   Lymphocytes Relative 25  12 - 46 %   Lymphs Abs 2.1  0.7 - 4.0 K/uL   Monocytes Relative 10  3 - 12 %   Monocytes Absolute 0.9  0.1 - 1.0 K/uL   Eosinophils Relative 2  0 - 5 %   Eosinophils Absolute 0.2  0.0 - 0.7 K/uL   Basophils Relative 0  0 - 1 %   Basophils Absolute 0.0  0.0 - 0.1 K/uL  GLUCOSE, CAPILLARY     Status: Abnormal   Collection Time    08/13/13  7:26 AM      Result Value Ref Range   Glucose-Capillary 141 (*) 70 - 99 mg/dL  GLUCOSE, CAPILLARY     Status: Abnormal   Collection Time    08/13/13 11:37 AM      Result Value Ref  Range   Glucose-Capillary 119 (*) 70 - 99 mg/dL  GLUCOSE, CAPILLARY     Status: Abnormal   Collection Time    08/13/13  3:56 PM      Result Value Ref Range   Glucose-Capillary 134 (*) 70 - 99 mg/dL  GLUCOSE, CAPILLARY     Status: None   Collection Time    08/13/13  7:31 PM      Result Value Ref Range   Glucose-Capillary 86  70 - 99 mg/dL  GLUCOSE, CAPILLARY     Status: Abnormal   Collection Time    08/13/13 11:06 PM      Result Value Ref Range   Glucose-Capillary 114 (*) 70 - 99 mg/dL  BASIC METABOLIC PANEL     Status: Abnormal   Collection Time    08/14/13  5:00 AM      Result Value Ref Range   Sodium 139  137 - 147 mEq/L   Potassium 4.6  3.7 - 5.3 mEq/L   Chloride 104  96 - 112 mEq/L   Comment: DELTA CHECK NOTED     REPEATED TO VERIFY   CO2 24  19 - 32 mEq/L   Glucose, Bld  120 (*) 70 - 99 mg/dL   BUN 5 (*) 6 - 23 mg/dL   Creatinine, Ser 0.78  0.50 - 1.10 mg/dL   Calcium 8.9  8.4 - 10.5 mg/dL   GFR calc non Af Amer 79 (*) >90 mL/min   GFR calc Af Amer >90  >90 mL/min   Comment: (NOTE)     The eGFR has been calculated using the CKD EPI equation.     This calculation has not been validated in all clinical situations.     eGFR's persistently <90 mL/min signify possible Chronic Kidney     Disease.  CBC     Status: None   Collection Time    08/14/13  5:00 AM      Result Value Ref Range   WBC 6.9  4.0 - 10.5 K/uL   RBC 4.42  3.87 - 5.11 MIL/uL   Hemoglobin 13.2  12.0 - 15.0 g/dL   HCT 40.3  36.0 - 46.0 %   MCV 91.2  78.0 - 100.0 fL   MCH 29.9  26.0 - 34.0 pg   MCHC 32.8  30.0 - 36.0 g/dL   RDW 13.8  11.5 - 15.5 %   Platelets 179  150 - 400 K/uL  GLUCOSE, CAPILLARY     Status: Abnormal   Collection Time    08/14/13  5:01 AM      Result Value Ref Range   Glucose-Capillary 125 (*) 70 - 99 mg/dL  GLUCOSE, CAPILLARY     Status: Abnormal   Collection Time    08/14/13  7:11 AM      Result Value Ref Range   Glucose-Capillary 114 (*) 70 - 99 mg/dL    Imaging / Studies: Dg Abd 2 Views  08/13/2013   CLINICAL DATA:  Followup small bowel obstruction.  EXAM: ABDOMEN - 2 VIEW  COMPARISON:  None.  FINDINGS: Similar appearance of dilated small bowel loops and air-fluid levels. A small amount of colonic gas is noted. No significant free intraperitoneal air.  IMPRESSION: 1. Similar appearance of small bowel obstruction.   Electronically Signed   By: Kerby Moors M.D.   On: 08/13/2013 08:14    Scheduled Meds: . diltiazem  120 mg Oral BID  . dorzolamide-timolol  1 drop Both Eyes BID  .  heparin  5,000 Units Subcutaneous 3 times per day  . insulin aspart  0-15 Units Subcutaneous TID WC  . irbesartan  150 mg Oral Daily  . metoCLOPramide (REGLAN) injection  5 mg Intravenous 4 times per day  . sodium chloride  3 mL Intravenous Q12H   Continuous Infusions:  PRN  Meds:.sodium chloride, acetaminophen, HYDROcodone-acetaminophen, morphine injection, ondansetron (ZOFRAN) IV, ondansetron, sodium chloride, sodium chloride, traMADol   Antibiotics: Anti-infectives   Start     Dose/Rate Route Frequency Ordered Stop   08/08/13 1800  cefOXitin (MEFOXIN) 1 g in dextrose 5 % 50 mL IVPB     1 g 100 mL/hr over 30 Minutes Intravenous 4 times per day 08/08/13 1451 08/08/13 1736   08/08/13 1000  cefOXitin (MEFOXIN) 2 g in dextrose 5 % 50 mL IVPB     2 g 100 mL/hr over 30 Minutes Intravenous On call to O.R. 08/08/13 0946 08/08/13 1130      Assessment/Plan Exploratory laparotomy, LOA for SBO POD#6 Post operative ileus-resolved -advance diet FL for lunch, soft for dinner -IS(pulling 1084m) -ambulate(5x per day) -minimize narcotics, tylenol, tramadol, vicodin for severe pain -DC IVF -decrease reglan, ok to wean off? -anticipate discharge in AM DM -CBGs are stable, continue SSI HTN -stable  9.9x5.6cm left gluteal musculature mass -likely a lipoma, however, recommend non emergent gadolinium enhanced MRI of pelvis.  i scheduled an MRI for her next Wednesday, however, [place]  Will obtain a disc copy of CT scan which the patient can take to her PCP Dr. OMarinus Maw  EErby Pian [facility] Center[facility] Pager [phone] [phone] 08/14/2013 8:28 AM

## 2013-08-14 NOTE — Progress Notes (Signed)
Per Radiologist Dr. Llana AlimentEntrikin they will be unable to perform MRI until staples are removed. NP on call paged.

## 2013-08-14 NOTE — Progress Notes (Signed)
General surgery attending:  I've interviewed and examined this patient this morning. I agree with the assessment and treatment plan outlined by Ms. Reibock,NP.  The patient is clearly turned the corner. Diet to be advanced and probable discharge tomorrow.  Angelia MouldHaywood M. Derrell LollingIngram, M.D., [facility] General and Minimally invasive Surgery Breast and Colorectal Surgery

## 2013-08-15 LAB — GLUCOSE, CAPILLARY: Glucose-Capillary: 126 mg/dL — ABNORMAL HIGH (ref 70–99)

## 2013-08-15 MED ORDER — TRAMADOL HCL 50 MG PO TABS: 50.0000 mg | ORAL_TABLET | Freq: Four times a day (QID) | ORAL | Status: DC | PRN

## 2013-08-15 MED ORDER — ACETAMINOPHEN 325 MG PO TABS: 650.0000 mg | ORAL_TABLET | ORAL | Status: AC | PRN

## 2013-08-15 NOTE — Progress Notes (Signed)
Gen. Surgery:  Patient is doing very well and looking forward to discharge home today. Tolerating regular diet. Having bowel movements. Ambulating.  Abdomen is soft. Midline wound clean the staples in place  Plan: [place] in [place] for another week, so we will arrange for her to see Dr. Daphine DeutscherMartin next week for staple removal  D/c reglan  Thereafter she plans to [place]. She will be given all records and they discovered her CT scan for followup with her PCP, arrangement for MRI of pelvis, and referral for opinion regarding her gluteal mass   Haywood M. Derrell LollingIngram, M.D., [facility] General and Minimally invasive Surgery Breast and Colorectal Surgery Office:   [phone] Pager:   [phone]

## 2013-08-15 NOTE — Progress Notes (Signed)
Radiology called for CD for  radiology studies this admission; to be ready for patient at discharge.  Radiology to call patient day shift nurse Dennard SchaumannMarian Hogan when ready.

## 2013-08-15 NOTE — Discharge Instructions (Signed)
ABDOMINAL SURGERY: POST OP INSTRUCTIONS ° °1. DIET: Follow a light bland diet the first 24 hours after arrival home, such as soup, liquids, crackers, etc.  Be sure to include lots of fluids daily.  Avoid fast food or heavy meals as your are more likely to get nauseated.  Eat a low fat the next few days after surgery.   °2. Take your usually prescribed home medications unless otherwise directed. °3. PAIN CONTROL: °a. Pain is best controlled by a usual combination of three different methods TOGETHER: °i. Ice/Heat °ii. Over the counter pain medication °iii. Prescription pain medication °b. Most patients will experience some swelling and bruising around the incisions.  Ice packs or heating pads (30-60 minutes up to 6 times a day) will help. Use ice for the first few days to help decrease swelling and bruising, then switch to heat to help relax tight/sore spots and speed recovery.  Some people prefer to use ice alone, heat alone, alternating between ice & heat.  Experiment to what works for you.  Swelling and bruising can take several weeks to resolve.   °c. It is helpful to take an over-the-counter pain medication regularly for the first few weeks.  Choose one of the following that works best for you: °i. Naproxen (Aleve, etc)  Two 220mg tabs twice a day °ii. Ibuprofen (Advil, etc) Three 200mg tabs four times a day (every meal & bedtime) °iii. Acetaminophen (Tylenol, etc) 500-650mg four times a day (every meal & bedtime) °d. A  prescription for pain medication (such as oxycodone, hydrocodone, etc) should be given to you upon discharge.  Take your pain medication as prescribed.  °i. If you are having problems/concerns with the prescription medicine (does not control pain, nausea, vomiting, rash, itching, etc), please call us [phone] to see if we need to switch you to a different pain medicine that will work better for you and/or control your side effect better. °ii. If you need a refill on your pain medication,  please contact your pharmacy.  They will contact our office to request authorization. Prescriptions will not be filled after 5 pm or on week-ends. °4. Avoid getting constipated.  Between the surgery and the pain medications, it is common to experience some constipation.  Increasing fluid intake and taking a fiber supplement (such as Metamucil, Citrucel, FiberCon, MiraLax, etc) 1-2 times a day regularly will usually help prevent this problem from occurring.  A mild laxative (prune juice, Milk of Magnesia, MiraLax, etc) should be taken according to package directions if there are no bowel movements after 48 hours.   °5. Watch out for diarrhea.  If you have many loose bowel movements, simplify your diet to bland foods & liquids for a few days.  Stop any stool softeners and decrease your fiber supplement.  Switching to mild anti-diarrheal medications (Kayopectate, Pepto Bismol) can help.  If this worsens or does not improve, please call us. °6. Wash / shower every day.  You may shower over the incision / wound.  Avoid baths until the skin is fully healed.  Continue to shower over incision(s) after the dressing is off. °7. Remove your waterproof bandages 5 days after surgery.  You may leave the incision open to air.  You may replace a dressing/Band-Aid to cover the incision for comfort if you wish. °8. ACTIVITIES as tolerated:   °a. You may resume regular (light) daily activities beginning the next day--such as daily self-care, walking, climbing stairs--gradually increasing activities as tolerated.  If you can   walk 30 minutes without difficulty, it is safe to try more intense activity such as jogging, treadmill, bicycling, low-impact aerobics, swimming, etc. b. Save the most intensive and strenuous activity for last such as sit-ups, heavy lifting, contact sports, etc  Refrain from any heavy lifting or straining until you are off narcotics for pain control.   c. DO NOT PUSH THROUGH PAIN.  Let pain be your guide: If it  hurts to do something, don't do it.  Pain is your body warning you to avoid that activity for another week until the pain goes down. d. You may drive when you are no longer taking prescription pain medication, you can comfortably wear a seatbelt, and you can safely maneuver your car and apply brakes. e. Bonita QuinYou may have sexual intercourse when it is comfortable.  9. FOLLOW UP in our office a. Please call [facility] at [phone] to set up an appointment to see your surgeon in the office for a follow-up appointment approximately 1-2 weeks after your surgery. b. Make sure that you call for this appointment the day you arrive home to insure a convenient appointment time. 10. IF YOU HAVE DISABILITY OR FAMILY LEAVE FORMS, BRING THEM TO THE OFFICE FOR PROCESSING.  DO NOT GIVE THEM TO YOUR DOCTOR.   WHEN TO CALL US [phone]: 1. Poor pain control 2. Reactions / problems with new medications (rash/itching, nausea, etc)  3. Fever over 101.5 F (38.5 C) 4. Inability to urinate 5. Nausea and/or vomiting 6. Worsening swelling or bruising 7. Continued bleeding from incision. 8. Increased pain, redness, or drainage from the incision  The clinic staff is available to answer your questions during regular business hours (8:30am-5pm).  Please dont hesitate to call and ask to speak to one of our nurses for clinical concerns.   A surgeon from [facility] is always on call at the hospitals   If you have a medical emergency, go to the nearest emergency room or call 911.    [facility]  [address], [address], [place], [place]  [id number] ? MAIN: [phone] ? TOLL FREE: [phone] ? FAX [phone] [email or web address]   Left gluteal(butt muscle) musculature mass -you will need to follow up with your primary care doctor to have an MRI scheduled to further evaluate this. -please bring a copy of the disc we provided you in case the radiologist needs to  review/compare the two.

## 2013-08-15 NOTE — Discharge Summary (Signed)
Physician Discharge Summary  Bonnie Briggs WGN:[id number] DOB: 23-Jun-1934 DOA: 08/04/2013  PCP: No primary provider on file.  Consultation: none  Admit date: 08/04/2013 Discharge date: 08/15/2013  Recommendations for Outpatient Follow-up:   Follow-up Information   Follow up with Valarie Merino, MD On 08/23/2013. (arrive by 12PM for a 12:20PM appt for a post op check and have your staples removed)    Specialty:  General Surgery   Contact information:   [address] [place] [address] [phone]      Discharge Diagnoses:  1. Small bowel obstruction 2. DM type II 3. HTN 4. Post operative ileus 5. 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 Sheron Nightingale 08/08/13   Discharge Condition: stable Disposition: home  Diet recommendation: carb modified  Filed Weights   08/07/13 0500  Weight: 180 lb (81.647 kg)     Filed Vitals:   08/15/13 0549  BP: 160/92  Pulse: 80  Temp: 98.3 F (36.8 C)  Resp: 18     Hospital Course:  Bonnie Briggs presented to [facility] with nausea, vomiting and abdominal pain.  She was found to have a small bowel obstruction.  She did not improve with conservative management and underwent an exploratory laparotomy with LOA.  She developed a post op ileus treated with bowel rest.  She was started on a diet and advanced as tolerated.  She was mobilized.  Pain well controlled with tylenol, tramadol and norco.  Her vital signs remained stable.  CBGs remained stable with SSI, metformin was held.  Incidentally, we found a left gluteal musculature mass.  We discussed work up, she preferred to have this done as outpatient in [facility].  A disc copy was provided for her records and for radiology review in the future.  On POD#7 the patient was tolerating a diet, having BMs, ambulating, pain well controlled.  She was therefore felt stable for discharge.  She will follow up in 1 week with Dr. Daphine Deutscher for a  post op check, staples removal and then return to [facility] for follow up with PCP.  She does not plan to return unless she has issues.  She was encouraged to call with questions or concerns.  Instructions provided to her and her daughter.    Discharge Instructions     Medication List    ASK your doctor about these medications       alum & mag hydroxide-simeth 200-200-20 MG/5ML suspension  Commonly known as:  MAALOX/MYLANTA  Take 30 mLs by mouth every 6 (six) hours as needed for indigestion or heartburn.     diltiazem 120 MG tablet  Commonly known as:  CARDIZEM  Take 120 mg by mouth 2 (two) times daily.     dorzolamide-timolol 22.3-6.8 MG/ML ophthalmic solution  Commonly known as:  COSOPT  Place 1 drop into both eyes 2 (two) times daily.     metFORMIN 500 MG (MOD) 24 hr tablet  Commonly known as:  GLUMETZA  Take 500 mg by mouth every evening.     promethazine 12.5 MG tablet  Commonly known as:  PHENERGAN  Take 12.5-25 mg by mouth every 6 (six) hours as needed for nausea or vomiting.     valsartan 160 MG tablet  Commonly known as:  DIOVAN  Take 160 mg by mouth 2 (two) times daily.           Follow-up Information   Follow up with Valarie Merino, MD On 08/23/2013. (arrive by 12PM for  a 12:20PM appt for a post op check and have your staples removed)    Specialty:  General Surgery   Contact information:   [address] [place] [address] [phone]        The results of significant diagnostics from this hospitalization (including imaging, microbiology, ancillary and laboratory) are listed below for reference.    Significant Diagnostic Studies: Ct Abdomen Pelvis W Contrast  08/04/2013   CLINICAL DATA:  Pain.  Emesis.  EXAM: CT ABDOMEN AND PELVIS WITH CONTRAST  TECHNIQUE: Multidetector CT imaging of the abdomen and pelvis was performed using the standard protocol following bolus administration of intravenous contrast.  CONTRAST:  50mL OMNIPAQUE IOHEXOL 300  MG/ML SOLN, 100mL OMNIPAQUE IOHEXOL 300 MG/ML SOLN  COMPARISON:  None.  FINDINGS: Liver normal. Hepatic veins and portal vein patent. The spleen and splenic vein are normal. Pancreas normal. No biliary distention. The gallbladder is slightly distended. Multiple gallstones are present. No pericholecystic fluid collection or gallbladder wall thickening noted.  Adrenals normal. Tiny simple cysts and parapelvic cysts both kidneys. No hydronephrosis or obstructing calcified ureteral stones noted. Pelvic phleboliths. Bladder nondistended. No hydronephrosis. Calcified uterine fibroid. Adnexa unremarkable. Small amount of free pelvic fluid.  No significant adenopathy. Abdominal aorta is widely patent with atherosclerotic vascular changes. Visceral vessels are patent.  The appendix normal. Multiple dilated loops of small bowel are noted with distention up to 3.9 cm noted. These findings are consistent with small-bowel obstruction. This appears to be in the mid to distal jejunal region. Etiology of small bowel obstruction not identified. Decompressed loops of distal small bowel noted. Large amount stool is noted within the colon. The colon is nondistended. No free air. No pneumatosis. Again mesenteric vessels are patent. Mesenteric venous structures are patent. No mesenteric mass. No hernia.  Large fat containing lesion noted in the left gluteal musculature. This measures 9.9 x 5.6 cm. Septations are noted within this fat containing density. A small 1.1 cm nodule in the lower aspect of this fatty mass is noted. Although this finding may represent a benign lipoma a more aggressive lesion such as a liposarcoma cannot be excluded. Nonemergent MRI of the pelvis with gadolinium enhancement is suggested. Heart size normal. Moderate right sided pleural effusion noted. Bibasilar atelectasis and/or infiltrates present. Degenerative changes lumbar spine and both hips. Tiny sclerotic density in the right hip, most likely a benign bone  island.  IMPRESSION: 1. Small bowel obstruction, most likely mid to distal jejunal. Etiology of obstruction is not identified. No pneumatosis, free air, or portal venous air. Mesenteric vasculature patent. 2. Multiple gallstones. Gallbladder slightly distended. No gallbladder wall thickening or pericholecystic fluid collections. 3. 9.9 x 5.6 cm fatty mass within the left gluteal musculature. This mass contains septations and a small 1.1 cm nodule in its inferior portion. Although this may represent a benign lipoma, a more aggressive lesion such as a liposarcoma cannot be excluded. Nonemergent gadolinium-enhanced MRI of the pelvis is suggested for further evaluation. 4. Small amount of free pelvic fluid. 5. Multiple calcified uterine fibroids. 6. Bibasilar atelectasis and/or infiltrates with moderate size right pleural effusion. These results were called by telephone at the time of interpretation on 08/04/2013 at 10:33 AM to Dr. Raeford RazorSTEPHEN KOHUT , who verbally acknowledged these results.   Electronically Signed   By: Maisie Fushomas  Register   On: 08/04/2013 10:35   Dg Chest Port 1 View  08/08/2013   CLINICAL DATA:  Preoperative evaluation for bowel obstruction  EXAM: PORTABLE CHEST - 1  VIEW  COMPARISON:  None.  FINDINGS: There is patchy bibasilar atelectatic change. Lungs elsewhere are clear. Heart size and pulmonary vascularity are normal. No adenopathy. Nasogastric tube tip and side port are in the stomach.  IMPRESSION: Patchy bibasilar atelectatic change. Lungs otherwise clear. Nasogastric tube tip and side port in stomach.   Electronically Signed   By: Bretta Bang M.D.   On: 08/08/2013 10:27   Dg Abd 2 Views  08/13/2013   CLINICAL DATA:  Followup small bowel obstruction.  EXAM: ABDOMEN - 2 VIEW  COMPARISON:  None.  FINDINGS: Similar appearance of dilated small bowel loops and air-fluid levels. A small amount of colonic gas is noted. No significant free intraperitoneal air.  IMPRESSION: 1. Similar appearance of  small bowel obstruction.   Electronically Signed   By: Signa Kell M.D.   On: 08/13/2013 08:14   Dg Abd 2 Views  08/12/2013   CLINICAL DATA:  Vomiting  EXAM: ABDOMEN - 2 VIEW  COMPARISON:  08/08/2013  FINDINGS: Small bowel dilatation remains but is improved. There are multiple small bowel air-fluid levels consistent with small bowel obstruction. There is now some gas in the right colon.  NG tube has been removed. No free air in the peritoneal cavity. Interval laparotomy.  IMPRESSION: Improving small bowel obstruction pattern.   Electronically Signed   By: Marlan Palau M.D.   On: 08/12/2013 10:14   Dg Abd 2 Views  08/08/2013   CLINICAL DATA:  Follow-up small bowel obstruction. Still having pain.  EXAM: ABDOMEN - 2 VIEW  COMPARISON:  08/07/2013  FINDINGS: Nasogastric tube is in place, tip overlying the level of the stomach. There are numerous significantly dilated small bowel loops which contain air-fluid levels on the erect view. There continues to be paucity of large bowel gas. Findings are consistent with persistent small-bowel obstruction. No free intraperitoneal air.  IMPRESSION: Persistent small bowel obstruction.   Electronically Signed   By: Rosalie Gums M.D.   On: 08/08/2013 08:31   Dg Abd 2 Views  08/07/2013   CLINICAL DATA:  Nausea, small bowel obstruction  EXAM: ABDOMEN - 2 VIEW  COMPARISON:  08/06/2013  FINDINGS: A nasogastric catheter remains within the stomach. Multiple dilated small bowel loops are noted. Air-fluid levels are again seen. No definitive free air is noted. The overall appearance is stable from the prior exam. Persistent atelectatic changes are noted in the bases bilaterally.  IMPRESSION: Stable small bowel obstruction.  Continued followup is recommended.   Electronically Signed   By: Alcide Clever M.D.   On: 08/07/2013 08:17   Dg Abd 2 Views  08/06/2013   CLINICAL DATA:  Bowel obstruction.  EXAM: ABDOMEN - 2 VIEW  COMPARISON:  08/05/2013.  FINDINGS: NG tube coiled in  stomach. Persistent dilated loops of small bowel are noted consistent with small bowel obstruction. No free air. No acute bony abnormality. Aortoiliac atherosclerotic vascular disease. Basilar atelectasis and small right pleural effusion.  IMPRESSION: 1. NG tube medical stomach. 2. Persistent prominent distention of small bowel consistent with small bowel obstruction. No significant interim improvement. 3. Bibasilar atelectasis and small right pleural effusion.   Electronically Signed   By: Maisie Fus  Register   On: 08/06/2013 08:41   Dg Abd 2 Views  08/05/2013   CLINICAL DATA:  Abdominal pain and distention. Follow-up bowel obstruction.  EXAM: ABDOMEN - 2 VIEW  COMPARISON:  07/04/2013  FINDINGS: The patient now has a nasogastric tube which is coiled in the stomach region. There continues to be  dilated loops of small bowel with air-fluid levels. The degree of small bowel distention has not significantly changed. There continues to be stool in the right colon. Mid small bowel loops measure up to 5.6 cm. Right basilar densities probably represent a combination of pleural fluid and atelectasis. Iodinated contrast in the urinary bladder. No clear evidence for free air.  IMPRESSION: Dilated loops of small bowel with air-fluid levels. The degree of small bowel distention has not decreased and findings are compatible with a high-grade small bowel obstruction.  Nasogastric tube is in the stomach region.  Right basilar chest densities are suggestive for pleural fluid and atelectasis.   Electronically Signed   By: Richarda OverlieAdam  Henn M.D.   On: 08/05/2013 08:33    Microbiology: Recent Results (from the past 240 hour(s))  SURGICAL PCR SCREEN     Status: None   Collection Time    08/08/13  9:14 AM      Result Value Ref Range Status   MRSA, PCR NEGATIVE  NEGATIVE Final   Staphylococcus aureus NEGATIVE  NEGATIVE Final   Comment:            The Xpert SA Assay (FDA     approved for NASAL specimens     in patients over 21 years  of age),     is one component of     a comprehensive surveillance     program.  Test performance has     been validated by [facility] for patients greater     than or equal to 77 year old.     It is not intended     to diagnose infection nor to     guide or monitor treatment.     Labs: Basic Metabolic Panel:  Recent Labs Lab 08/08/13 1529 08/09/13 0500 08/10/13 0428 08/12/13 1025 08/14/13 0500  NA  --  141 137 134* 139  K  --  4.2 4.4 4.6 4.6  CL  --  102 100 95* 104  CO2  --  28 24 26 24   GLUCOSE  --  90 114* 172* 120*  BUN  --  16 12 9  5*  CREATININE 0.76 0.80 0.67 0.72 0.78  CALCIUM  --  8.2* 8.9 9.2 8.9   Liver Function Tests: No results found for this basename: AST, ALT, ALKPHOS, BILITOT, PROT, ALBUMIN,  in the last 168 hours No results found for this basename: LIPASE, AMYLASE,  in the last 168 hours No results found for this basename: AMMONIA,  in the last 168 hours CBC:  Recent Labs Lab 08/09/13 0500 08/10/13 0428 08/12/13 1025 08/13/13 0453 08/14/13 0500  WBC 8.3 7.8 7.9 8.6 6.9  NEUTROABS  --   --  6.0 5.4  --   HGB 13.2 14.4 15.2* 13.5 13.2  HCT 41.3 44.2 45.7 41.7 40.3  MCV 94.1 92.7 90.1 92.9 91.2  PLT 140* 148* 170 171 179   Cardiac Enzymes: No results found for this basename: CKTOTAL, CKMB, CKMBINDEX, TROPONINI,  in the last 168 hours BNP: BNP (last 3 results) No results found for this basename: PROBNP,  in the last 8760 hours CBG:  Recent Labs Lab 08/14/13 0711 08/14/13 1250 08/14/13 1656 08/14/13 2122 08/15/13 0739  GLUCAP 114* 109* 110* 120* 126*    Active Problems:   SBO (small bowel obstruction)   HTN (hypertension)   S/P exploratory laparotomyJune2015   Time coordinating discharge: <30 mins  Signed:  Emina Riebock, ANP-BC

## 2013-08-15 NOTE — Discharge Summary (Signed)
Agree with discharge plans as summarized. Agree with followup plans as summarized. Discussed with patient personally. See my note from earlier today.  Angelia MouldHaywood M. Derrell LollingIngram, M.D., [facility] General and Minimally invasive Surgery Breast and Colorectal Surgery Office:   [phone]

## 2013-08-22 ENCOUNTER — Ambulatory Visit (HOSPITAL_COMMUNITY): Payer: PRIVATE HEALTH INSURANCE

## 2013-08-23 ENCOUNTER — Ambulatory Visit (INDEPENDENT_AMBULATORY_CARE_PROVIDER_SITE_OTHER): Payer: Medicare Other | Admitting: Surgery

## 2013-08-23 ENCOUNTER — Encounter (INDEPENDENT_AMBULATORY_CARE_PROVIDER_SITE_OTHER): Payer: Self-pay | Admitting: Surgery

## 2013-08-23 VITALS — BP 140/80 | HR 70 | Resp 12 | Ht 63.0 in | Wt 173.0 lb

## 2013-08-23 DIAGNOSIS — Z9889 Other specified postprocedural states: Secondary | ICD-10-CM

## 2013-08-23 NOTE — Progress Notes (Signed)
Bonnie Briggs 79 y.o.  Body mass index is 30.65 kg/(m^2).  Patient Active Problem List   Diagnosis Date Noted  . S/P exploratory laparotomyJune2015 08/08/2013  . SBO (small bowel obstruction) 08/04/2013  . HTN (hypertension) 08/04/2013    No Known Allergies    Past Surgical History  Procedure Laterality Date  . Laparotomy N/A 08/08/2013    Procedure: EXPLORATORY LAPAROTOMY;  Surgeon: Valarie MerinoMatthew B Abella Shugart, MD;  Location: [facility];  Service: General;  Laterality: N/A;   No primary provider on file. 1. S/P laparotomy     Doing very well afer exploratory laparotomy for SBO due to adhesive band.  Staples out and steristrips applied.  [place] and [place] for medical followup.  Will need to see someone about the mass on her buttocks.  That was discussed with her in the hospital.   Matt B. Daphine DeutscherMartin, MD, [facility]  [facility] [phone] beeper [phone]  08/23/2013 1:34 PM

## 2013-08-23 NOTE — Patient Instructions (Signed)
Surgeons that I know in [place]:  [place] and [place]

## 2019-09-05 ENCOUNTER — Other Ambulatory Visit: Payer: Self-pay

## 2019-09-05 ENCOUNTER — Inpatient Hospital Stay (HOSPITAL_COMMUNITY)
Admission: EM | Admit: 2019-09-05 | Discharge: 2019-09-13 | DRG: 337 | Disposition: A | Discharge status: Home / Self Care | Payer: Medicare Other | Attending: Family Medicine | Admitting: Family Medicine

## 2019-09-05 ENCOUNTER — Inpatient Hospital Stay (HOSPITAL_COMMUNITY): Payer: Medicare Other

## 2019-09-05 ENCOUNTER — Encounter (HOSPITAL_COMMUNITY): Payer: Self-pay

## 2019-09-05 ENCOUNTER — Emergency Department (HOSPITAL_COMMUNITY): Payer: Medicare Other

## 2019-09-05 DIAGNOSIS — E860 Dehydration: Secondary | ICD-10-CM | POA: Diagnosis not present

## 2019-09-05 DIAGNOSIS — Z4659 Encounter for fitting and adjustment of other gastrointestinal appliance and device: Secondary | ICD-10-CM

## 2019-09-05 DIAGNOSIS — Z20822 Contact with and (suspected) exposure to covid-19: Secondary | ICD-10-CM | POA: Diagnosis present

## 2019-09-05 DIAGNOSIS — Z9889 Other specified postprocedural states: Secondary | ICD-10-CM

## 2019-09-05 DIAGNOSIS — K5652 Intestinal adhesions [bands] with complete obstruction: Secondary | ICD-10-CM | POA: Diagnosis not present

## 2019-09-05 DIAGNOSIS — Z7982 Long term (current) use of aspirin: Secondary | ICD-10-CM | POA: Diagnosis not present

## 2019-09-05 DIAGNOSIS — Z79891 Long term (current) use of opiate analgesic: Secondary | ICD-10-CM

## 2019-09-05 DIAGNOSIS — R413 Other amnesia: Secondary | ICD-10-CM | POA: Diagnosis present

## 2019-09-05 DIAGNOSIS — I10 Essential (primary) hypertension: Secondary | ICD-10-CM | POA: Diagnosis not present

## 2019-09-05 DIAGNOSIS — Z0189 Encounter for other specified special examinations: Secondary | ICD-10-CM

## 2019-09-05 DIAGNOSIS — R109 Unspecified abdominal pain: Secondary | ICD-10-CM | POA: Diagnosis present

## 2019-09-05 DIAGNOSIS — K56609 Unspecified intestinal obstruction, unspecified as to partial versus complete obstruction: Secondary | ICD-10-CM

## 2019-09-05 DIAGNOSIS — Z8719 Personal history of other diseases of the digestive system: Secondary | ICD-10-CM

## 2019-09-05 DIAGNOSIS — E876 Hypokalemia: Secondary | ICD-10-CM | POA: Diagnosis not present

## 2019-09-05 DIAGNOSIS — R040 Epistaxis: Secondary | ICD-10-CM | POA: Diagnosis present

## 2019-09-05 DIAGNOSIS — E119 Type 2 diabetes mellitus without complications: Secondary | ICD-10-CM

## 2019-09-05 DIAGNOSIS — Z8673 Personal history of transient ischemic attack (TIA), and cerebral infarction without residual deficits: Secondary | ICD-10-CM

## 2019-09-05 DIAGNOSIS — E785 Hyperlipidemia, unspecified: Secondary | ICD-10-CM | POA: Diagnosis present

## 2019-09-05 DIAGNOSIS — Z7984 Long term (current) use of oral hypoglycemic drugs: Secondary | ICD-10-CM

## 2019-09-05 DIAGNOSIS — Z79899 Other long term (current) drug therapy: Secondary | ICD-10-CM

## 2019-09-05 DIAGNOSIS — R748 Abnormal levels of other serum enzymes: Secondary | ICD-10-CM | POA: Diagnosis not present

## 2019-09-05 DIAGNOSIS — Z885 Allergy status to narcotic agent status: Secondary | ICD-10-CM | POA: Diagnosis not present

## 2019-09-05 LAB — COMPREHENSIVE METABOLIC PANEL
ALT: 15 U/L (ref 0–44)
AST: 17 U/L (ref 15–41)
Albumin: 4.4 g/dL (ref 3.5–5.0)
Alkaline Phosphatase: 70 U/L (ref 38–126)
Anion gap: 16 — ABNORMAL HIGH (ref 5–15)
BUN: 19 mg/dL (ref 8–23)
CO2: 21 mmol/L — ABNORMAL LOW (ref 22–32)
Calcium: 10.4 mg/dL — ABNORMAL HIGH (ref 8.9–10.3)
Chloride: 105 mmol/L (ref 98–111)
Creatinine, Ser: 0.68 mg/dL (ref 0.44–1.00)
GFR calc Af Amer: >60 mL/min (ref >60)
GFR calc non Af Amer: >60 mL/min (ref >60)
Glucose, Bld: 162 mg/dL — ABNORMAL HIGH (ref 70–99)
Potassium: 3.7 mmol/L (ref 3.5–5.1)
Sodium: 142 mmol/L (ref 135–145)
Total Bilirubin: 0.7 mg/dL (ref 0.3–1.2)
Total Protein: 8 g/dL (ref 6.5–8.1)

## 2019-09-05 LAB — URINALYSIS, ROUTINE W REFLEX MICROSCOPIC
Bacteria, UA: NONE SEEN
Bilirubin Urine: NEGATIVE
Glucose, UA: NEGATIVE mg/dL
Hgb urine dipstick: NEGATIVE
Ketones, ur: NEGATIVE mg/dL
Nitrite: NEGATIVE
Protein, ur: 30 mg/dL — AB
Specific Gravity, Urine: 1.019 (ref 1.005–1.030)
pH: 7 (ref 5.0–8.0)

## 2019-09-05 LAB — CBC
HCT: 47.3 % — ABNORMAL HIGH (ref 36.0–46.0)
Hemoglobin: 15.3 g/dL — ABNORMAL HIGH (ref 12.0–15.0)
MCH: 30.8 pg (ref 26.0–34.0)
MCHC: 32.3 g/dL (ref 30.0–36.0)
MCV: 95.4 fL (ref 80.0–100.0)
Platelets: 158 10*3/uL (ref 150–400)
RBC: 4.96 MIL/uL (ref 3.87–5.11)
RDW: 13.7 % (ref 11.5–15.5)
WBC: 6.4 10*3/uL (ref 4.0–10.5)
nRBC: 0 % (ref 0.0–0.2)

## 2019-09-05 LAB — SARS CORONAVIRUS 2 BY RT PCR (HOSPITAL ORDER, PERFORMED IN ~~LOC~~ HOSPITAL LAB): SARS Coronavirus 2: NEGATIVE

## 2019-09-05 LAB — LIPASE, BLOOD: Lipase: 94 U/L — ABNORMAL HIGH (ref 11–51)

## 2019-09-05 MED ORDER — LIDOCAINE HCL URETHRAL/MUCOSAL 2 % EX GEL
1.0000 "application " | Freq: Once | CUTANEOUS | Status: AC
  Administered 2019-09-05: 1
  Filled 2019-09-05: qty 11

## 2019-09-05 MED ORDER — SODIUM CHLORIDE 0.9% FLUSH: 3.0000 mL | Freq: Once | INTRAVENOUS | Status: DC

## 2019-09-05 MED ORDER — SODIUM CHLORIDE (PF) 0.9 % IJ SOLN
INTRAMUSCULAR | Status: AC
  Filled 2019-09-05: qty 50

## 2019-09-05 MED ORDER — IOHEXOL 300 MG/ML  SOLN
100.0000 mL | Freq: Once | INTRAMUSCULAR | Status: AC | PRN
  Administered 2019-09-05: 100 mL via INTRAVENOUS

## 2019-09-05 MED ORDER — ONDANSETRON 4 MG PO TBDP
4.0000 mg | ORAL_TABLET | Freq: Once | ORAL | Status: AC | PRN
Start: 2019-09-05 — End: 2019-09-05
  Administered 2019-09-05: 4 mg via ORAL
  Filled 2019-09-05: qty 1

## 2019-09-05 MED ORDER — SODIUM CHLORIDE 0.9 % IV BOLUS
500.0000 mL | Freq: Once | INTRAVENOUS | Status: AC
  Administered 2019-09-05: 500 mL via INTRAVENOUS

## 2019-09-05 MED ORDER — OXYMETAZOLINE HCL 0.05 % NA SOLN
1.0000 | Freq: Once | NASAL | Status: AC
  Administered 2019-09-05: 1 via NASAL
  Filled 2019-09-05: qty 30

## 2019-09-05 NOTE — ED Notes (Signed)
During lab draw pt experienced x1 episode of vomiting.

## 2019-09-05 NOTE — ED Provider Notes (Signed)
[facility]-EMERGENCY DEPT Provider Note   CSN: [id number] Arrival date & time: 09/05/19  1508     History Chief Complaint  Patient presents with   Abdominal Pain   Emesis    Bonnie Briggs is a 85 y.o. female with a past medical history of hypertension, diabetes, prior stroke currently on full dose aspirin daily, history of SBO in 2015 presenting to the ED with a chief complaint of abdominal pain.  For the past 24 hours has been having upper abdominal pain with associated nonbloody, nonbilious emesis.  She had a normal bowel movement yesterday and today has not been able to pass gas.  She started having pain yesterday after she ate a meal at [employer].  She tried taking Tums yesterday with only minimal improvement in her symptoms.  Denies any urinary symptoms, fever, chest pain, shortness of breath, hematochezia or melena, sick contacts with similar symptoms.  She reports compliance with her home medications.  She is unsure if this feels similar to her symptoms related to SBO in 2015.  HPI     Past Medical History:  Diagnosis Date   Diabetes mellitus without complication (HCC)    Hypertension     Patient Active Problem List   Diagnosis Date Noted   S/P exploratory laparotomyJune2015 08/08/2013   SBO (small bowel obstruction) (HCC) 08/04/2013   HTN (hypertension) 08/04/2013    Past Surgical History:  Procedure Laterality Date   LAPAROTOMY N/A 08/08/2013   Procedure: EXPLORATORY LAPAROTOMY;  Surgeon: Valarie Merino, MD;  Location: [facility] ORS;  Service: General;  Laterality: N/A;     OB History   No obstetric history on file.     No family history on file.  Social History   Tobacco Use   Smoking status: Never Smoker  Substance Use Topics   Alcohol use: No   Drug use: No    Home Medications Prior to Admission medications   Medication Sig Start Date End Date Taking? Authorizing Provider  acetaminophen (TYLENOL) 325 MG tablet Take 2  tablets (650 mg total) by mouth every 4 (four) hours as needed for mild pain or headache. Patient taking differently: Take 325 mg by mouth every 4 (four) hours as needed for mild pain or headache.  08/15/13  Yes Riebock, Emina, NP  acetaZOLAMIDE (DIAMOX) 250 MG tablet Take 500 mg by mouth 2 (two) times daily.   Yes [provider]  aspirin 325 MG tablet Take 325 mg by mouth daily.   Yes [provider]  atorvastatin (LIPITOR) 40 MG tablet Take 40 mg by mouth daily.   Yes [provider]  baclofen (LIORESAL) 10 MG tablet Take 10 mg by mouth daily as needed for muscle spasms.    Yes [provider]  cholecalciferol (VITAMIN D3) 25 MCG (1000 UNIT) tablet Take 1,000 Units by mouth daily.   Yes [provider]  diltiazem (TIAZAC) 360 MG 24 hr capsule Take 360 mg by mouth daily.   Yes [provider]  dorzolamide-timolol (COSOPT) 22.3-6.8 MG/ML ophthalmic solution Place 1 drop into both eyes daily.    Yes [provider]  valsartan (DIOVAN) 320 MG tablet Take 320 mg by mouth daily.   Yes [provider]  Vitamin A 3 MG (10000 UT) TABS Take 10,000 Units by mouth daily.   Yes [provider]  metFORMIN (GLUMETZA) 500 MG (MOD) 24 hr tablet Take 500 mg by mouth every evening.    [provider]  promethazine (PHENERGAN) 12.5 MG  tablet Take 12.5-25 mg by mouth every 6 (six) hours as needed for nausea or vomiting. Patient not taking: Reported on 09/05/2019    [provider]  traMADol (ULTRAM) 50 MG tablet Take 1 tablet (50 mg total) by mouth every 6 (six) hours as needed for moderate pain. Patient not taking: Reported on 09/05/2019 08/15/13   Ashok Norris, NP    Allergies    Codeine  Review of Systems   Review of Systems  Constitutional: Negative for appetite change, chills and fever.  HENT: Negative for ear pain, rhinorrhea, sneezing and sore throat.   Eyes: Negative for photophobia and visual disturbance.   Respiratory: Negative for cough, chest tightness, shortness of breath and wheezing.   Cardiovascular: Negative for chest pain and palpitations.  Gastrointestinal: Positive for abdominal pain, nausea and vomiting. Negative for blood in stool, constipation and diarrhea.  Genitourinary: Negative for dysuria, hematuria and urgency.  Musculoskeletal: Negative for myalgias.  Skin: Negative for rash.  Neurological: Negative for dizziness, weakness and light-headedness.    Physical Exam Updated Vital Signs BP 140/87    Pulse 96    Temp 98.4 F (36.9 C)    Resp 20    Ht 5\' 3"  (1.6 m)    Wt 54.4 kg    SpO2 97%    BMI 21.26 kg/m   Physical Exam Vitals and nursing note reviewed.  Constitutional:      General: She is not in acute distress.    Appearance: She is well-developed.  HENT:     Head: Normocephalic and atraumatic.     Nose: Nose normal.  Eyes:     General: No scleral icterus.       Left eye: No discharge.     Conjunctiva/sclera: Conjunctivae normal.  Cardiovascular:     Rate and Rhythm: Normal rate and regular rhythm.     Heart sounds: Normal heart sounds. No murmur heard.  No friction rub. No gallop.   Pulmonary:     Effort: Pulmonary effort is normal. No respiratory distress.     Breath sounds: Normal breath sounds.  Abdominal:     General: Bowel sounds are normal. There is no distension.     Palpations: Abdomen is soft.     Tenderness: There is abdominal tenderness in the right upper quadrant, epigastric area, periumbilical area and left upper quadrant. There is no guarding.  Musculoskeletal:        General: Normal range of motion.     Cervical back: Normal range of motion and neck supple.  Skin:    General: Skin is warm and dry.     Findings: No rash.  Neurological:     Mental Status: She is alert.     Motor: No abnormal muscle tone.     Coordination: Coordination normal.     ED Results / Procedures / Treatments   Labs (all labs ordered are listed, but only  abnormal results are displayed) Labs Reviewed  LIPASE, BLOOD - Abnormal; Notable for the following components:      Result Value   Lipase 94 (*)    All other components within normal limits  COMPREHENSIVE METABOLIC PANEL - Abnormal; Notable for the following components:   CO2 21 (*)    Glucose, Bld 162 (*)    Calcium 10.4 (*)    Anion gap 16 (*)    All other components within normal limits  CBC - Abnormal; Notable for the following components:   Hemoglobin 15.3 (*)    HCT 47.3 (*)  All other components within normal limits  URINALYSIS, ROUTINE W REFLEX MICROSCOPIC - Abnormal; Notable for the following components:   APPearance HAZY (*)    Protein, ur 30 (*)    Leukocytes,Ua MODERATE (*)    Non Squamous Epithelial 0-5 (*)    All other components within normal limits  SARS CORONAVIRUS 2 BY RT PCR (HOSPITAL ORDER, PERFORMED IN  HOSPITAL LAB)    EKG None  Radiology CT ABDOMEN PELVIS W CONTRAST  Result Date: 09/05/2019 CLINICAL DATA:  Abdominal pain, vomiting EXAM: CT ABDOMEN AND PELVIS WITH CONTRAST TECHNIQUE: Multidetector CT imaging of the abdomen and pelvis was performed using the standard protocol following bolus administration of intravenous contrast. CONTRAST:  100mL OMNIPAQUE IOHEXOL 300 MG/ML  SOLN COMPARISON:  08/04/2013 FINDINGS: Lower chest: Moderate right pleural effusion is present with mild atelectasis within the lung bases bilaterally, right greater than left. Moderate coronary artery calcification. Global cardiac size within normal limits. Hepatobiliary: Cholelithiasis noted. The gallbladder is distended, however common there is no pericholecystic inflammatory change identified. Liver is unremarkable. No intra or extrahepatic biliary ductal dilation. Pancreas: Unremarkable Spleen: Unremarkable Adrenals/Urinary Tract: The adrenal glands are unremarkable. Simple cortical cyst noted within the left kidney. Mild asymmetric cortical atrophy of the right kidney. The  kidneys are otherwise unremarkable. The bladder is largely decompressed and is unremarkable. Stomach/Bowel: There is a complete distal small bowel obstruction identified with a point of transition noted within the right mid abdomen on axial image # 59/2, coronal image # 48/4, and sagittal image # 66/5. There is no associated mass or fluid collection in this region. The distal small bowel is decompressed. The more proximal small bowel demonstrates fluid distension. All loops of bowel demonstrate normal enhancement. There is no pneumatosis or free intraperitoneal gas. Mild mesenteric edema is noted within the right mid abdomen, however, there is no free intraperitoneal fluid or loculated intra-abdominal fluid collections. The colon is unremarkable. The appendix is not visualized and is likely absent. Vascular/Lymphatic: No pathologic adenopathy within the abdomen and pelvis. The abdominal vasculature is age-appropriate with moderate aortoiliac atherosclerotic calcification. No aneurysm. Reproductive: Lobulated appearance of the a uterus with coarse calcifications is in keeping with multiple uterine fibroids. The ovaries are unremarkable. No adnexal masses. Other: The rectum is unremarkable. Musculoskeletal: No acute bone abnormality. IMPRESSION: Distal small bowel obstruction, presumably due to an adhesion, with moderate mesenteric edema identified. No evidence of bowel ischemia. No perforation. Aortic Atherosclerosis (ICD10-I70.0). Electronically Signed   By: Helyn NumbersAshesh  Parikh MD   On: 09/05/2019 17:50    Procedures Procedures (including critical care time)  Medications Ordered in ED Medications  sodium chloride flush (NS) 0.9 % injection 3 mL (has no administration in time range)  sodium chloride (PF) 0.9 % injection (has no administration in time range)  ondansetron (ZOFRAN-ODT) disintegrating tablet 4 mg (4 mg Oral Given 09/05/19 1541)  sodium chloride 0.9 % bolus 500 mL (500 mLs Intravenous New Bag/Given  09/05/19 1713)  iohexol (OMNIPAQUE) 300 MG/ML solution 100 mL (100 mLs Intravenous Contrast Given 09/05/19 1718)    ED Course  I have reviewed the triage vital signs and the nursing notes.  Pertinent labs & imaging results that were available during my care of the patient were reviewed by me and considered in my medical decision making (see chart for details).  Clinical Course as of Sep 05 1826  Wed Sep 05, 2019  1709 Lipase(!): 94 [HK]    Clinical Course User Index [HK] Dietrich PatesKhatri, Quyen Cutsforth, [place]PA-C   MDM  Rules/Calculators/A&P                          85 year old female with a past medical history of hypertension, diabetes, prior stroke currently on a full dose aspirin daily, history of SBO in 2015 presenting to the ED with a chief complaint of abdominal pain.  Reports associated nonbloody, nonbilious emesis.  Had a normal bowel movement yesterday and today but is unable to pass gas.  Has not had anything to eat today.  Minimal improvement noted with Tums.  On exam abdomen is tender in the epigastric, periumbilical area without rebound or guarding.  She denies urinary symptoms, chest pain or shortness of breath.  She is afebrile without recent use of antipyretics.  Lab work ordered and interpreted;  significant for anion gap of 16, lipase mildly elevated at 94.  CT of the abdomen pelvis shows findings consistent with distal small bowel obstruction without ischemia or perforation.  I have ordered an NG tube, IV fluids.  Spoke to general surgery Carolynne Edouard who recommends small bowel protocol and admit to medicine. Will consult hospitalist for admission.   All imaging, if done today, including plain films, CT scans, and ultrasounds, independently reviewed by me, and interpretations confirmed via formal radiology reads.  Portions of this note were generated with Scientist, clinical (histocompatibility and immunogenetics). Dictation errors may occur despite best attempts at proofreading.   Final Clinical Impression(s) / ED Diagnoses Final  diagnoses:  Intestinal adhesions with complete obstruction [facility])    Rx / DC Orders ED Discharge Orders    None       Dietrich Pates, PA-C 09/05/19 1829    Lorre Nick, MD 09/05/19 2008

## 2019-09-05 NOTE — ED Triage Notes (Signed)
Pt c/o abd pain and emesis that started last night. Pt states that her stomach started hurting all over and has experienced 4 episodes of emesis, brown in color. Pt states she was able to have a normal BM this morning, however, has not been able to pass gas. Pt states that she has a hx of SBO. Pt is A&Ox4.

## 2019-09-05 NOTE — ED Notes (Signed)
Epistaxis with NG tube insertion. Nose continues to bleed. MD made aware.

## 2019-09-05 NOTE — ED Notes (Signed)
Patient informed of ED MD recommendation to pull the NG tube out due to positioning. Patient would like to wait until they hear back from the hospitalist before pull the NG tube out.

## 2019-09-05 NOTE — H&P (Signed)
History and Physical   [name] QMV:[id number] DOB: [birth year] DOA: 09/05/2019  Referring MD/NP/PA: Dr. Lorre Nick  PCP: Patient, No Pcp Per   Outpatient Specialists: None  Patient coming from: Home.  Lives in [place]  Chief Complaint: Nausea vomiting abdominal pain  HPI: Bonnie Briggs is a 85 y.o. female with medical history significant of previous small bowel obstruction in 2015 status post exploratory laparotomy, diabetes, hypertension who is visiting her children from [place] brought in today secondary to abdominal pain with nausea and vomiting.  This has been going on for the last 24 hours.  The vomitus is clear nonbloody and nonbilious.  She is also has not had any bowel movement.  No gas passage today.  Denied any fever.  Patient took some Tums.  She ate some chicken of from [employer] and thought that was the cause.  She was seen in the ER and initial work-up now showing small bowel obstruction.  This appears to be similar to what she had back in 2015.  No hematemesis no melena no bright red blood per rectum.  Patient being admitted to the hospital was small bowel obstruction probably secondary to previous surgery.  General surgery consulted to follow along..  ED Course: Temperature 98.4 blood pressure 116/88 pulse 96 respiratory rate of 20 oxygen sat 99% on room air.  CBC largely within normal except for hemoglobin 15.3.  Chemistry largely within normal calcium 10.4 CO2 21.  Glucose 162.  CT abdomen pelvis shows distal small bowel obstruction presumably due to an adhesion with moderate mesenteric edema.  Patient being admitted and NG tube inserted.  Review of Systems: As per HPI otherwise 10 point review of systems negative.    Past Medical History:  Diagnosis Date  . Diabetes mellitus without complication (HCC)   . Hypertension     Past Surgical History:  Procedure Laterality Date  . LAPAROTOMY N/A 08/08/2013   Procedure: EXPLORATORY LAPAROTOMY;  Surgeon:  Valarie Merino, MD;  Location: [facility];  Service: General;  Laterality: N/A;     reports that she has never smoked. She does not have any smokeless tobacco history on file. She reports that she does not drink alcohol and does not use drugs.  Allergies  Allergen Reactions  . Codeine Swelling    No family history on file.   Prior to Admission medications   Medication Sig Start Date End Date Taking? Authorizing Provider  acetaminophen (TYLENOL) 325 MG tablet Take 2 tablets (650 mg total) by mouth every 4 (four) hours as needed for mild pain or headache. Patient taking differently: Take 325 mg by mouth every 4 (four) hours as needed for mild pain or headache.  08/15/13  Yes Riebock, Emina, NP  acetaZOLAMIDE (DIAMOX) 250 MG tablet Take 500 mg by mouth 2 (two) times daily.   Yes [provider]  aspirin 325 MG tablet Take 325 mg by mouth daily.   Yes [provider]  atorvastatin (LIPITOR) 40 MG tablet Take 40 mg by mouth daily.   Yes [provider]  baclofen (LIORESAL) 10 MG tablet Take 10 mg by mouth daily as needed for muscle spasms.    Yes [provider]  cholecalciferol (VITAMIN D3) 25 MCG (1000 UNIT) tablet Take 1,000 Units by mouth daily.   Yes [provider]  diltiazem (TIAZAC) 360 MG 24 hr capsule Take 360 mg by mouth daily.   Yes [provider]  dorzolamide-timolol (COSOPT) 22.3-6.8 MG/ML ophthalmic solution Place 1 drop into both  eyes daily.    Yes [provider]  valsartan (DIOVAN) 320 MG tablet Take 320 mg by mouth daily.   Yes [provider]  Vitamin A 3 MG (10000 UT) TABS Take 10,000 Units by mouth daily.   Yes [provider]  metFORMIN (GLUMETZA) 500 MG (MOD) 24 hr tablet Take 500 mg by mouth every evening.    [provider]  promethazine (PHENERGAN) 12.5 MG tablet Take 12.5-25 mg by mouth every 6 (six) hours as needed for nausea or vomiting. Patient not taking: Reported on  09/05/2019    [provider]  traMADol (ULTRAM) 50 MG tablet Take 1 tablet (50 mg total) by mouth every 6 (six) hours as needed for moderate pain. Patient not taking: Reported on 09/05/2019 08/15/13   Ashok Norris, NP    Physical Exam: Vitals:   09/05/19 1519 09/05/19 1911  BP: 140/87 (!) 169/88  Pulse: 96 91  Resp: 20 18  Temp: 98.4 F (36.9 C)   SpO2: 97% 99%  Weight: 54.4 kg   Height: 5\' 3"  (1.6 m)       Constitutional: NAD, acutely ill looking no acute distress Vitals:   09/05/19 1519 09/05/19 1911  BP: 140/87 (!) 169/88  Pulse: 96 91  Resp: 20 18  Temp: 98.4 F (36.9 C)   SpO2: 97% 99%  Weight: 54.4 kg   Height: 5\' 3"  (1.6 m)    Eyes: PERRL, lids and conjunctivae normal ENMT: Mucous membranes are moist. Posterior pharynx clear of any exudate or lesions.Normal dentition.  Neck: normal, supple, no masses, no thyromegaly Respiratory: clear to auscultation bilaterally, no wheezing, no crackles. Normal respiratory effort. No accessory muscle use.  Cardiovascular: Regular rate and rhythm, no murmurs / rubs / gallops. No extremity edema. 2+ pedal pulses. No carotid bruits.  Abdomen: Mildly distended with diffuse tenderness no masses palpated. No hepatosplenomegaly. Bowel sounds positive.  Musculoskeletal: no clubbing / cyanosis. No joint deformity upper and lower extremities. Good ROM, no contractures. Normal muscle tone.  Skin: no rashes, lesions, ulcers. No induration Neurologic: CN 2-12 grossly intact. Sensation intact, DTR normal. Strength 5/5 in all 4.  Psychiatric: Normal judgment and insight. Alert and oriented x 3. Normal mood.     Labs on Admission: I have personally reviewed following labs and imaging studies  CBC: Recent Labs  Lab 09/05/19 1600  WBC 6.4  HGB 15.3*  HCT 47.3*  MCV 95.4  PLT 158   Basic Metabolic Panel: Recent Labs  Lab 09/05/19 1600  NA 142  K 3.7  CL 105  CO2 21*  GLUCOSE 162*  BUN 19  CREATININE 0.68  CALCIUM  10.4*   GFR: Estimated Creatinine Clearance: 44.8 mL/min (by C-G formula based on SCr of 0.68 mg/dL). Liver Function Tests: Recent Labs  Lab 09/05/19 1600  AST 17  ALT 15  ALKPHOS 70  BILITOT 0.7  PROT 8.0  ALBUMIN 4.4   Recent Labs  Lab 09/05/19 1600  LIPASE 94*   No results for input(s): AMMONIA in the last 168 hours. Coagulation Profile: No results for input(s): INR, PROTIME in the last 168 hours. Cardiac Enzymes: No results for input(s): CKTOTAL, CKMB, CKMBINDEX, TROPONINI in the last 168 hours. BNP (last 3 results) No results for input(s): PROBNP in the last 8760 hours. HbA1C: No results for input(s): HGBA1C in the last 72 hours. CBG: No results for input(s): GLUCAP in the last 168 hours. Lipid Profile: No results for input(s): CHOL, HDL, LDLCALC, TRIG, CHOLHDL, LDLDIRECT in the last  72 hours. Thyroid Function Tests: No results for input(s): TSH, T4TOTAL, FREET4, T3FREE, THYROIDAB in the last 72 hours. Anemia Panel: No results for input(s): VITAMINB12, FOLATE, FERRITIN, TIBC, IRON, RETICCTPCT in the last 72 hours. Urine analysis:    Component Value Date/Time   COLORURINE YELLOW 09/05/2019 1525   APPEARANCEUR HAZY (A) 09/05/2019 1525   LABSPEC 1.019 09/05/2019 1525   PHURINE 7.0 09/05/2019 1525   GLUCOSEU NEGATIVE 09/05/2019 1525   HGBUR NEGATIVE 09/05/2019 1525   BILIRUBINUR NEGATIVE 09/05/2019 1525   KETONESUR NEGATIVE 09/05/2019 1525   PROTEINUR 30 (A) 09/05/2019 1525   UROBILINOGEN 0.2 08/04/2013 1043   NITRITE NEGATIVE 09/05/2019 1525   LEUKOCYTESUR MODERATE (A) 09/05/2019 1525   Sepsis Labs: @LABRCNTIP (procalcitonin:4,lacticidven:4) )No results found for this or any previous visit (from the past 240 hour(s)).   Radiological Exams on Admission: CT ABDOMEN PELVIS W CONTRAST  Result Date: 09/05/2019 CLINICAL DATA:  Abdominal pain, vomiting EXAM: CT ABDOMEN AND PELVIS WITH CONTRAST TECHNIQUE: Multidetector CT imaging of the abdomen and pelvis was  performed using the standard protocol following bolus administration of intravenous contrast. CONTRAST:  100mL OMNIPAQUE IOHEXOL 300 MG/ML  SOLN COMPARISON:  08/04/2013 FINDINGS: Lower chest: Moderate right pleural effusion is present with mild atelectasis within the lung bases bilaterally, right greater than left. Moderate coronary artery calcification. Global cardiac size within normal limits. Hepatobiliary: Cholelithiasis noted. The gallbladder is distended, however common there is no pericholecystic inflammatory change identified. Liver is unremarkable. No intra or extrahepatic biliary ductal dilation. Pancreas: Unremarkable Spleen: Unremarkable Adrenals/Urinary Tract: The adrenal glands are unremarkable. Simple cortical cyst noted within the left kidney. Mild asymmetric cortical atrophy of the right kidney. The kidneys are otherwise unremarkable. The bladder is largely decompressed and is unremarkable. Stomach/Bowel: There is a complete distal small bowel obstruction identified with a point of transition noted within the right mid abdomen on axial image # 59/2, coronal image # 48/4, and sagittal image # 66/5. There is no associated mass or fluid collection in this region. The distal small bowel is decompressed. The more proximal small bowel demonstrates fluid distension. All loops of bowel demonstrate normal enhancement. There is no pneumatosis or free intraperitoneal gas. Mild mesenteric edema is noted within the right mid abdomen, however, there is no free intraperitoneal fluid or loculated intra-abdominal fluid collections. The colon is unremarkable. The appendix is not visualized and is likely absent. Vascular/Lymphatic: No pathologic adenopathy within the abdomen and pelvis. The abdominal vasculature is age-appropriate with moderate aortoiliac atherosclerotic calcification. No aneurysm. Reproductive: Lobulated appearance of the a uterus with coarse calcifications is in keeping with multiple uterine  fibroids. The ovaries are unremarkable. No adnexal masses. Other: The rectum is unremarkable. Musculoskeletal: No acute bone abnormality. IMPRESSION: Distal small bowel obstruction, presumably due to an adhesion, with moderate mesenteric edema identified. No evidence of bowel ischemia. No perforation. Aortic Atherosclerosis (ICD10-I70.0). Electronically Signed   By: Helyn NumbersAshesh  Parikh MD   On: 09/05/2019 17:50      Assessment/Plan Principal Problem:   SBO (small bowel obstruction) (HCC) Active Problems:   HTN (hypertension)   S/P exploratory laparotomyJune2015   DM (diabetes mellitus) (HCC)     #1 small bowel obstruction: Presumably mechanical.  Admit the patient.  Keep n.p.o.  Insert NG tube.  IV fluids, pain control as well as nausea control.  Follow surgical recommendations.  #2 diabetes: Hold Metformin.  Sliding scale insulin.  Patient will be NPO.  Monitor capillary blood glucose closely.  #3 hypertension: Blood pressure slightly elevated.  No oral medications.  Will consider IV labetalol if needed.  #4 dehydration: Hydrate patient and monitor closely.   DVT prophylaxis: Lovenox Code Status: Full code Family Communication: Daughter at bedside and son over the phone Disposition Plan: Home Consults called: Dr. Carolynne Edouard, general surgery Admission status: Inpatient  Severity of Illness: The appropriate patient status for this patient is INPATIENT. Inpatient status is judged to be reasonable and necessary in order to provide the required intensity of service to ensure the patient's safety. The patient's presenting symptoms, physical exam findings, and initial radiographic and laboratory data in the context of their chronic comorbidities is felt to place them at high risk for further clinical deterioration. Furthermore, it is not anticipated that the patient will be medically stable for discharge from the hospital within 2 midnights of admission. The following factors support the patient  status of inpatient.   " The patient's presenting symptoms include abdominal pain with nausea vomiting. " The worrisome physical exam findings include distended abdomen. " The initial radiographic and laboratory data are worrisome because of CT findings of small bowel obstruction. " The chronic co-morbidities include diabetes with hypertension.   * I certify that at the point of admission it is my clinical judgment that the patient will require inpatient hospital care spanning beyond 2 midnights from the point of admission due to high intensity of service, high risk for further deterioration and high frequency of surveillance required.Lonia Blood MD [facility] Pager [phone]  If 7PM-7AM, please contact night-coverage [email or web address] Password [facility]  09/05/2019, 7:34 PM

## 2019-09-06 ENCOUNTER — Encounter (HOSPITAL_COMMUNITY): Payer: Self-pay | Admitting: Internal Medicine

## 2019-09-06 ENCOUNTER — Inpatient Hospital Stay (HOSPITAL_COMMUNITY): Payer: Medicare Other

## 2019-09-06 LAB — COMPREHENSIVE METABOLIC PANEL
ALT: 13 U/L (ref 0–44)
AST: 16 U/L (ref 15–41)
Albumin: 3.6 g/dL (ref 3.5–5.0)
Alkaline Phosphatase: 59 U/L (ref 38–126)
Anion gap: 12 (ref 5–15)
BUN: 19 mg/dL (ref 8–23)
CO2: 23 mmol/L (ref 22–32)
Calcium: 9.2 mg/dL (ref 8.9–10.3)
Chloride: 105 mmol/L (ref 98–111)
Creatinine, Ser: 0.68 mg/dL (ref 0.44–1.00)
GFR calc Af Amer: >60 mL/min (ref >60)
GFR calc non Af Amer: >60 mL/min (ref >60)
Glucose, Bld: 154 mg/dL — ABNORMAL HIGH (ref 70–99)
Potassium: 3.6 mmol/L (ref 3.5–5.1)
Sodium: 140 mmol/L (ref 135–145)
Total Bilirubin: 0.6 mg/dL (ref 0.3–1.2)
Total Protein: 6.5 g/dL (ref 6.5–8.1)

## 2019-09-06 LAB — CBC
HCT: 42.8 % (ref 36.0–46.0)
Hemoglobin: 13.5 g/dL (ref 12.0–15.0)
MCH: 30.1 pg (ref 26.0–34.0)
MCHC: 31.5 g/dL (ref 30.0–36.0)
MCV: 95.3 fL (ref 80.0–100.0)
Platelets: 143 10*3/uL — ABNORMAL LOW (ref 150–400)
RBC: 4.49 MIL/uL (ref 3.87–5.11)
RDW: 13.7 % (ref 11.5–15.5)
WBC: 6.6 10*3/uL (ref 4.0–10.5)
nRBC: 0 % (ref 0.0–0.2)

## 2019-09-06 LAB — GLUCOSE, CAPILLARY
Glucose-Capillary: 129 mg/dL — ABNORMAL HIGH (ref 70–99)
Glucose-Capillary: 116 mg/dL — ABNORMAL HIGH (ref 70–99)
Glucose-Capillary: 118 mg/dL — ABNORMAL HIGH (ref 70–99)

## 2019-09-06 LAB — HEMOGLOBIN A1C
Hgb A1c MFr Bld: 6.5 % — ABNORMAL HIGH (ref 4.8–5.6)
Mean Plasma Glucose: 139.85 mg/dL

## 2019-09-06 MED ORDER — SODIUM CHLORIDE 0.9% FLUSH
10.0000 mL | Freq: Two times a day (BID) | INTRAVENOUS | Status: DC
  Administered 2019-09-10: 10 mL

## 2019-09-06 MED ORDER — ENOXAPARIN SODIUM 40 MG/0.4ML ~~LOC~~ SOLN
40.0000 mg | Freq: Every day | SUBCUTANEOUS | Status: DC
  Administered 2019-09-06 – 2019-09-12 (×8): 40 mg via SUBCUTANEOUS
  Filled 2019-09-06 (×8): qty 0.4

## 2019-09-06 MED ORDER — BUTAMBEN-TETRACAINE-BENZOCAINE 2-2-14 % EX AERO
INHALATION_SPRAY | CUTANEOUS | Status: AC
  Filled 2019-09-06: qty 20

## 2019-09-06 MED ORDER — INSULIN ASPART 100 UNIT/ML ~~LOC~~ SOLN
0.0000 [IU] | Freq: Three times a day (TID) | SUBCUTANEOUS | Status: DC
  Administered 2019-09-06: 1 [IU] via SUBCUTANEOUS

## 2019-09-06 MED ORDER — INSULIN ASPART 100 UNIT/ML ~~LOC~~ SOLN: 0.0000 [IU] | Freq: Four times a day (QID) | SUBCUTANEOUS | Status: DC

## 2019-09-06 MED ORDER — ONDANSETRON HCL 4 MG/2ML IJ SOLN
4.0000 mg | Freq: Four times a day (QID) | INTRAMUSCULAR | Status: DC | PRN
  Administered 2019-09-06 – 2019-09-10 (×9): 4 mg via INTRAVENOUS
  Filled 2019-09-06 (×10): qty 2

## 2019-09-06 MED ORDER — KETOROLAC TROMETHAMINE 15 MG/ML IJ SOLN
15.0000 mg | Freq: Four times a day (QID) | INTRAMUSCULAR | Status: AC | PRN
  Administered 2019-09-06 – 2019-09-10 (×7): 15 mg via INTRAVENOUS
  Filled 2019-09-06 (×7): qty 1

## 2019-09-06 MED ORDER — INSULIN ASPART 100 UNIT/ML ~~LOC~~ SOLN
0.0000 [IU] | Freq: Four times a day (QID) | SUBCUTANEOUS | Status: DC
  Administered 2019-09-07 – 2019-09-09 (×3): 1 [IU] via SUBCUTANEOUS
  Administered 2019-09-09: 2 [IU] via SUBCUTANEOUS
  Administered 2019-09-09: 1 [IU] via SUBCUTANEOUS
  Administered 2019-09-10: 2 [IU] via SUBCUTANEOUS
  Administered 2019-09-12: 1 [IU] via SUBCUTANEOUS

## 2019-09-06 MED ORDER — SODIUM CHLORIDE 0.9% FLUSH: 10.0000 mL | INTRAVENOUS | Status: DC | PRN

## 2019-09-06 MED ORDER — ONDANSETRON HCL 4 MG PO TABS: 4.0000 mg | ORAL_TABLET | Freq: Four times a day (QID) | ORAL | Status: DC | PRN

## 2019-09-06 MED ORDER — OXYMETAZOLINE HCL 0.05 % NA SOLN
1.0000 | Freq: Two times a day (BID) | NASAL | Status: DC
  Administered 2019-09-06 – 2019-09-12 (×10): 1 via NASAL
  Filled 2019-09-06 (×2): qty 15

## 2019-09-06 MED ORDER — LIDOCAINE HCL URETHRAL/MUCOSAL 2 % EX GEL
CUTANEOUS | Status: AC
  Filled 2019-09-06: qty 30

## 2019-09-06 MED ORDER — HYDRALAZINE HCL 20 MG/ML IJ SOLN
5.0000 mg | Freq: Four times a day (QID) | INTRAMUSCULAR | Status: DC | PRN
  Administered 2019-09-07: 5 mg via INTRAVENOUS
  Filled 2019-09-06: qty 1

## 2019-09-06 MED ORDER — LACTATED RINGERS IV SOLN: INTRAVENOUS | Status: DC

## 2019-09-06 NOTE — ED Notes (Signed)
This nurse spoke with the on call surgeon to notify him of discontinuation of NG tube by previous nurse with recommendation from Dr. Mikeal Hawthorne. This nurse was told that surgeon had no further recommendations at this time.

## 2019-09-06 NOTE — Progress Notes (Signed)
PROGRESS NOTE    Bonnie Briggs  YOV:[id number] DOB: Dec 25, 1934 DOA: 09/05/2019 PCP: Patient, No Pcp Per   Chef Complaints: nausea vomiting and abd pain x 24 hrs  Brief Narrative: 86 y.o. female with medical history significant of previous small bowel obstruction in 2015 status post exploratory laparotomy, diabetes, hypertension who is visiting her children from [place] brought in today secondary to abdominal pain with nausea and vomiting.  This has been going on for the last 24 hours.  The vomitus is clear nonbloody and nonbilious.  She is also has not had any bowel movement.  No gas passage today.  Denied any fever.  Patient took some Tums.  She ate some chicken of from [employer] and thought that was the cause.  She was seen in the ER and initial work-up now showing small bowel obstruction.  This appears to be similar to what she had back in 2015.  No hematemesis no melena no bright red blood per rectum.  Patient being admitted to the hospital was small bowel obstruction probably secondary to previous surgery.  General surgery consulted to follow along..  ED Course: Temperature 98.4 blood pressure 116/88 pulse 96 respiratory rate of 20 oxygen sat 99% on room air.  CBC largely within normal except for hemoglobin 15.3.  Chemistry largely within normal calcium 10.4 CO2 21.  Glucose 162.  CT abdomen pelvis shows distal small bowel obstruction presumably due to an adhesion with moderate mesenteric edema.  Patient being admitted and NG tube inserted.  Subjective:  No nause or vomiting , no flatus, may be little. Last BM 7/14 am NGT taken out as it was not in right place reports she saw surgeon this am Had nasal bleeding last night NG tube intolerance and removed, bleeding stopped with Afrin.  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  Appreciate surgery input going for NG tube insertion by radiology then as per protocol.  Continue n.p.o. IV fluids.    Dehydration: Due to #1.   Continue on IV fluids.  HTN on valsartan, diamox and diltiazem at home: Blood pressure stable Home meds on hold due to #1.  Add as needed IV meds.  DM/HLD on metformin and lipitor at home: P.o. meds on hold in the morning.  Keep on sliding scale insulin.  Monitor.  A1c controlled at 6.5 on 09/06/19 Recent Labs  Lab 09/06/19 0722  GLUCAP 129*   Hx of Stroke/memory issues: on asa 325 mg, statins.  Due to nasal bleeding caution with aspirin and also patient is n.p.o. unable to use p.o.  DVT prophylaxis: enoxaparin (LOVENOX) injection 40 mg Start: 09/06/19 0100 Code Status:FULL Family Communication: plan of care discussed with patient and family at bedside.  Status is: Inpatient  Remains inpatient appropriate because:IV treatments appropriate due to intensity of illness or inability to take PO and Inpatient level of care appropriate due to severity of illness   Dispo: The patient is from: Home from [facility]              Anticipated d/c is to: Home              Anticipated d/c date is: 2 days              Patient currently is not medically stable to d/c.   Nutrition: Diet Order            Diet NPO time specified  Diet effective now  Body mass index is 21.26 kg/m.  Consultants:see note  Procedures:see note Microbiology:see note  Medications: Scheduled Meds: . enoxaparin (LOVENOX) injection  40 mg Subcutaneous QHS  . insulin aspart  0-9 Units Subcutaneous TID WC  . sodium chloride flush  10-40 mL Intracatheter Q12H  . sodium chloride flush  3 mL Intravenous Once   Continuous Infusions: . lactated ringers 100 mL/hr at 09/06/19 0105    Antimicrobials: Anti-infectives (From admission, onward)   None       Objective: Vitals: Today's Vitals   09/06/19 0000 09/06/19 0107 09/06/19 0131 09/06/19 0513  BP: (!) 155/83 (!) 159/92  110/70  Pulse: 90 84  73  Resp: 18 16  18   Temp:  98.5 F (36.9 C)  98.2 F (36.8 C)  TempSrc:  Oral  Oral  SpO2: 97%  96%  95%  Weight:      Height:      PainSc:  6  3      Intake/Output Summary (Last 24 hours) at 09/06/2019 0741 Last data filed at 09/06/2019 0550 Gross per 24 hour  Intake 205.69 ml  Output 0 ml  Net 205.69 ml   Filed Weights   09/05/19 1519  Weight: 54.4 kg   Weight change:    Intake/Output from previous day: 07/14 0701 - 07/15 0700 In: 205.7 [I.V.:205.7] Out: 0  Intake/Output this shift: No intake/output data recorded.  Examination:  General exam: AAOx3,NAD,weak appearing. HEENT:Oral mucosa moist, Ear/Nose WNL grossly,dentition normal. Respiratory system: bilaterally clear,no wheezing or crackles,no use of accessory muscle, non tender. Cardiovascular system: S1 & S2 +, regular, No JVD. Gastrointestinal system: Abdomen soft, mildy Tender on central/epigastric and suprapubic area,ND, BS sluggish. Nervous System:Alert, awake, moving extremities and grossly nonfocal Extremities: No edema, distal peripheral pulses palpable.  Skin: No rashes,no icterus. MSK: Normal muscle bulk,tone, power  Data Reviewed: I have personally reviewed following labs and imaging studies CBC: Recent Labs  Lab 09/05/19 1600 09/06/19 0226  WBC 6.4 6.6  HGB 15.3* 13.5  HCT 47.3* 42.8  MCV 95.4 95.3  PLT 158 143*   Basic Metabolic Panel: Recent Labs  Lab 09/05/19 1600 09/06/19 0226  NA 142 140  K 3.7 3.6  CL 105 105  CO2 21* 23  GLUCOSE 162* 154*  BUN 19 19  CREATININE 0.68 0.68  CALCIUM 10.4* 9.2   GFR: Estimated Creatinine Clearance: 44.8 mL/min (by C-G formula based on SCr of 0.68 mg/dL). Liver Function Tests: Recent Labs  Lab 09/05/19 1600 09/06/19 0226  AST 17 16  ALT 15 13  ALKPHOS 70 59  BILITOT 0.7 0.6  PROT 8.0 6.5  ALBUMIN 4.4 3.6   Recent Labs  Lab 09/05/19 1600  LIPASE 94*   No results for input(s): AMMONIA in the last 168 hours. Coagulation Profile: No results for input(s): INR, PROTIME in the last 168 hours. Cardiac Enzymes: No results for  input(s): CKTOTAL, CKMB, CKMBINDEX, TROPONINI in the last 168 hours. BNP (last 3 results) No results for input(s): PROBNP in the last 8760 hours. HbA1C: Recent Labs    09/06/19 0042  HGBA1C 6.5*   CBG: Recent Labs  Lab 09/06/19 0722  GLUCAP 129*   Lipid Profile: No results for input(s): CHOL, HDL, LDLCALC, TRIG, CHOLHDL, LDLDIRECT in the last 72 hours. Thyroid Function Tests: No results for input(s): TSH, T4TOTAL, FREET4, T3FREE, THYROIDAB in the last 72 hours. Anemia Panel: No results for input(s): VITAMINB12, FOLATE, FERRITIN, TIBC, IRON, RETICCTPCT in the last 72 hours. Sepsis Labs: No results for input(s): PROCALCITON,  LATICACIDVEN in the last 168 hours.  Recent Results (from the past 240 hour(s))  SARS Coronavirus 2 by RT PCR (hospital order, performed in [facility] hospital lab) Nasopharyngeal Nasopharyngeal Swab     Status: None   Collection Time: 09/05/19  9:01 PM   Specimen: Nasopharyngeal Swab  Result Value Ref Range Status   SARS Coronavirus 2 NEGATIVE NEGATIVE Final    Comment: (NOTE) SARS-CoV-2 target nucleic acids are NOT DETECTED.  The SARS-CoV-2 RNA is generally detectable in upper and lower respiratory specimens during the acute phase of infection. The lowest concentration of SARS-CoV-2 viral copies this assay can detect is 250 copies / mL. A negative result does not preclude SARS-CoV-2 infection and should not be used as the sole basis for treatment or other patient management decisions.  A negative result may occur with improper specimen collection / handling, submission of specimen other than nasopharyngeal swab, presence of viral mutation(s) within the areas targeted by this assay, and inadequate number of viral copies (<250 copies / mL). A negative result must be combined with clinical observations, patient history, and epidemiological information.  Fact Sheet for Patients:   BoilerBrush.com.cy  Fact Sheet for Healthcare  Providers: https://pope.com/  This test is not yet approved or  cleared by the [place] FDA and has been authorized for detection and/or diagnosis of SARS-CoV-2 by FDA under an Emergency Use Authorization (EUA).  This EUA will remain in effect (meaning this test can be used) for the duration of the COVID-19 declaration under Section 564(b)(1) of the Act, 21 U.S.C. section 360bbb-3(b)(1), unless the authorization is terminated or revoked sooner.  Performed at [facility], [address], [place], [place] [address]       Radiology Studies: CT ABDOMEN PELVIS W CONTRAST  Result Date: 09/05/2019 CLINICAL DATA:  Abdominal pain, vomiting EXAM: CT ABDOMEN AND PELVIS WITH CONTRAST TECHNIQUE: Multidetector CT imaging of the abdomen and pelvis was performed using the standard protocol following bolus administration of intravenous contrast. CONTRAST:  OMNIPAQUE IOHEXOL 300 MG/ML  SOLN COMPARISON:  08/04/2013 FINDINGS: Lower chest: Moderate right pleural effusion is present with mild atelectasis within the lung bases bilaterally, right greater than left. Moderate coronary artery calcification. Global cardiac size within normal limits. Hepatobiliary: Cholelithiasis noted. The gallbladder is distended, however common there is no pericholecystic inflammatory change identified. Liver is unremarkable. No intra or extrahepatic biliary ductal dilation. Pancreas: Unremarkable Spleen: Unremarkable Adrenals/Urinary Tract: The adrenal glands are unremarkable. Simple cortical cyst noted within the left kidney. Mild asymmetric cortical atrophy of the right kidney. The kidneys are otherwise unremarkable. The bladder is largely decompressed and is unremarkable. Stomach/Bowel: There is a complete distal small bowel obstruction identified with a point of transition noted within the right mid abdomen on axial image # 59/2, coronal image # 48/4, and sagittal image # 66/5.  There is no associated mass or fluid collection in this region. The distal small bowel is decompressed. The more proximal small bowel demonstrates fluid distension. All loops of bowel demonstrate normal enhancement. There is no pneumatosis or free intraperitoneal gas. Mild mesenteric edema is noted within the right mid abdomen, however, there is no free intraperitoneal fluid or loculated intra-abdominal fluid collections. The colon is unremarkable. The appendix is not visualized and is likely absent. Vascular/Lymphatic: No pathologic adenopathy within the abdomen and pelvis. The abdominal vasculature is age-appropriate with moderate aortoiliac atherosclerotic calcification. No aneurysm. Reproductive: Lobulated appearance of the a uterus with coarse calcifications is in keeping with multiple uterine fibroids.  The ovaries are unremarkable. No adnexal masses. Other: The rectum is unremarkable. Musculoskeletal: No acute bone abnormality. IMPRESSION: Distal small bowel obstruction, presumably due to an adhesion, with moderate mesenteric edema identified. No evidence of bowel ischemia. No perforation. Aortic Atherosclerosis (ICD10-I70.0). Electronically Signed   By: Helyn Numbers MD   On: 09/05/2019 17:50   DG Abd Portable 1 View  Result Date: 09/05/2019 CLINICAL DATA:  85 year old female status post NG placement. EXAM: PORTABLE ABDOMEN - 1 VIEW COMPARISON:  Thirty radiograph dated 09/05/2019. FINDINGS: Partially visualized enteric tube extends to the level of the GE junction and fold back on itself with tip in the distal esophagus. Recommend retraction and repositioning. There is distended stomach as well as mildly distended loops of small bowel measuring up to 3.5 cm. IMPRESSION: 1. Enteric tube folded back on itself with tip in the distal esophagus. Recommend retraction and repositioning. 2. Distended stomach and mildly distended small bowel loops measuring up to 3.5 cm. Electronically Signed   By: Elgie Collard M.D.   On: 09/05/2019 21:37   DG Abd Portable 1 View  Result Date: 09/05/2019 CLINICAL DATA:  Enteric catheter placement EXAM: PORTABLE ABDOMEN - 1 VIEW COMPARISON:  08/13/2013, 09/05/2019 FINDINGS: Frontal view of the lower chest and upper abdomen demonstrates an enteric catheter coiled back upon itself, overlying the gastric fundus. Diffuse gaseous distention of the stomach and small bowel again noted lung bases are clear. IMPRESSION: 1. Enteric catheter coiled over the gastric fundus. 2. Persistent small bowel obstruction. Electronically Signed   By: Sharlet Salina M.D.   On: 09/05/2019 20:24     LOS: 1 day   Lanae Boast, MD [facility]  09/06/2019, 7:41 AM

## 2019-09-06 NOTE — Consult Note (Signed)
Reason for Consult: Vomiting Referring Physician: Dr. Sammuel HinesKc  Bonnie Briggs is an 85 y.o. female.  HPI: The patient is an 85 year old black female who began having abdominal pain yesterday.  She has had several episodes of nausea and vomiting.  She states she has been passing flatus.  She apparently had this similar problem about 6 years ago and may have required surgery.  She denies any fevers or chills.  She denies any weight loss.  She does seem to have some progressive confusion according to the family  Past Medical History:  Diagnosis Date  . Diabetes mellitus without complication (HCC)   . Hypertension     Past Surgical History:  Procedure Laterality Date  . LAPAROTOMY N/A 08/08/2013   Procedure: EXPLORATORY LAPAROTOMY;  Surgeon: Valarie MerinoMatthew B Martin, MD;  Location: [facility];  Service: General;  Laterality: N/A;    History reviewed. No pertinent family history.  Social History:  reports that she has never smoked. She does not have any smokeless tobacco history on file. She reports that she does not drink alcohol and does not use drugs.  Allergies:  Allergies  Allergen Reactions  . Codeine Swelling    Medications: I have reviewed the patient's current medications.  Results for orders placed or performed during the hospital encounter of 09/05/19 (from the past 48 hour(s))  Urinalysis, Routine w reflex microscopic     Status: Abnormal   Collection Time: 09/05/19  3:25 PM  Result Value Ref Range   Color, Urine YELLOW YELLOW   APPearance HAZY (A) CLEAR   Specific Gravity, Urine 1.019 1.005 - 1.030   pH 7.0 5.0 - 8.0   Glucose, UA NEGATIVE NEGATIVE mg/dL   Hgb urine dipstick NEGATIVE NEGATIVE   Bilirubin Urine NEGATIVE NEGATIVE   Ketones, ur NEGATIVE NEGATIVE mg/dL   Protein, ur 30 (A) NEGATIVE mg/dL   Nitrite NEGATIVE NEGATIVE   Leukocytes,Ua MODERATE (A) NEGATIVE   RBC / HPF 0-5 0 - 5 RBC/hpf   WBC, UA 0-5 0 - 5 WBC/hpf   Bacteria, UA NONE SEEN NONE SEEN   Squamous Epithelial /  LPF 6-10 0 - 5   Mucus PRESENT    Ca Oxalate Crys, UA PRESENT    Non Squamous Epithelial 0-5 (A) NONE SEEN    Comment: Performed at [facility], [address] [place], [place] [address]  Lipase, blood     Status: Abnormal   Collection Time: 09/05/19  4:00 PM  Result Value Ref Range   Lipase 94 (H) 11 - 51 U/L    Comment: Performed at [facility], [address] [place], [place] [address]  Comprehensive metabolic panel     Status: Abnormal   Collection Time: 09/05/19  4:00 PM  Result Value Ref Range   Sodium 142 135 - 145 mmol/L   Potassium 3.7 3.5 - 5.1 mmol/L   Chloride 105 98 - 111 mmol/L   CO2 21 (L) 22 - 32 mmol/L   Glucose, Bld 162 (H) 70 - 99 mg/dL    Comment: Glucose reference range applies only to samples taken after fasting for at least 8 hours.   BUN 19 8 - 23 mg/dL   Creatinine, Ser 4.780.68 0.44 - 1.00 mg/dL   Calcium 29.510.4 (H) 8.9 - 10.3 mg/dL   Total Protein 8.0 6.5 - 8.1 g/dL   Albumin 4.4 3.5 - 5.0 g/dL   AST 17 15 - 41 U/L   ALT 15 0 - 44 U/L   Alkaline Phosphatase  70 38 - 126 U/L   Total Bilirubin 0.7 0.3 - 1.2 mg/dL   GFR calc non Af Amer >60 >60 mL/min   GFR calc Af Amer >60 >60 mL/min   Anion gap 16 (H) 5 - 15    Comment: Performed at [facility], [address] [place], [place] [address]  CBC     Status: Abnormal   Collection Time: 09/05/19  4:00 PM  Result Value Ref Range   WBC 6.4 4.0 - 10.5 K/uL   RBC 4.96 3.87 - 5.11 MIL/uL   Hemoglobin 15.3 (H) 12.0 - 15.0 g/dL   HCT 93.2 (H) 36 - 46 %   MCV 95.4 80.0 - 100.0 fL   MCH 30.8 26.0 - 34.0 pg   MCHC 32.3 30.0 - 36.0 g/dL   RDW 67.1 24.5 - 80.9 %   Platelets 158 150 - 400 K/uL   nRBC 0.0 0.0 - 0.2 %    Comment: Performed at [facility], [address], [place], [place] [address]  SARS Coronavirus 2 by RT PCR (hospital order, performed in [facility] hospital lab) Nasopharyngeal Nasopharyngeal Swab     Status: None    Collection Time: 09/05/19  9:01 PM   Specimen: Nasopharyngeal Swab  Result Value Ref Range   SARS Coronavirus 2 NEGATIVE NEGATIVE    Comment: (NOTE) SARS-CoV-2 target nucleic acids are NOT DETECTED.  The SARS-CoV-2 RNA is generally detectable in upper and lower respiratory specimens during the acute phase of infection. The lowest concentration of SARS-CoV-2 viral copies this assay can detect is 250 copies / mL. A negative result does not preclude SARS-CoV-2 infection and should not be used as the sole basis for treatment or other patient management decisions.  A negative result may occur with improper specimen collection / handling, submission of specimen other than nasopharyngeal swab, presence of viral mutation(s) within the areas targeted by this assay, and inadequate number of viral copies (<250 copies / mL). A negative result must be combined with clinical observations, patient history, and epidemiological information.  Fact Sheet for Patients:   BoilerBrush.com.cy  Fact Sheet for Healthcare Providers: https://pope.com/  This test is not yet approved or  cleared by the [place] FDA and has been authorized for detection and/or diagnosis of SARS-CoV-2 by FDA under an Emergency Use Authorization (EUA).  This EUA will remain in effect (meaning this test can be used) for the duration of the COVID-19 declaration under Section 564(b)(1) of the Act, 21 U.S.C. section 360bbb-3(b)(1), unless the authorization is terminated or revoked sooner.  Performed at [facility], [address], [place], [place] [address]   Hemoglobin A1c     Status: Abnormal   Collection Time: 09/06/19 12:42 AM  Result Value Ref Range   Hgb A1c MFr Bld 6.5 (H) 4.8 - 5.6 %    Comment: (NOTE) Pre diabetes:          5.7%-6.4%  Diabetes:              >6.4%  Glycemic control for   <7.0% adults with diabetes    Mean Plasma Glucose  139.85 mg/dL    Comment: Performed at [facility], [address], [place], [place] [address]  Comprehensive metabolic panel     Status: Abnormal   Collection Time: 09/06/19  2:26 AM  Result Value Ref Range   Sodium 140 135 - 145 mmol/L   Potassium 3.6 3.5 - 5.1 mmol/L   Chloride 105  98 - 111 mmol/L   CO2 23 22 - 32 mmol/L   Glucose, Bld 154 (H) 70 - 99 mg/dL    Comment: Glucose reference range applies only to samples taken after fasting for at least 8 hours.   BUN 19 8 - 23 mg/dL   Creatinine, Ser 1.74 0.44 - 1.00 mg/dL   Calcium 9.2 8.9 - 08.1 mg/dL   Total Protein 6.5 6.5 - 8.1 g/dL   Albumin 3.6 3.5 - 5.0 g/dL   AST 16 15 - 41 U/L   ALT 13 0 - 44 U/L   Alkaline Phosphatase 59 38 - 126 U/L   Total Bilirubin 0.6 0.3 - 1.2 mg/dL   GFR calc non Af Amer >60 >60 mL/min   GFR calc Af Amer >60 >60 mL/min   Anion gap 12 5 - 15    Comment: Performed at [facility], [address] [place], [place] [address]  CBC     Status: Abnormal   Collection Time: 09/06/19  2:26 AM  Result Value Ref Range   WBC 6.6 4.0 - 10.5 K/uL   RBC 4.49 3.87 - 5.11 MIL/uL   Hemoglobin 13.5 12.0 - 15.0 g/dL   HCT 56.3 36 - 46 %   MCV 95.3 80.0 - 100.0 fL   MCH 30.1 26.0 - 34.0 pg   MCHC 31.5 30.0 - 36.0 g/dL   RDW 14.9 70.2 - 63.7 %   Platelets 143 (L) 150 - 400 K/uL   nRBC 0.0 0.0 - 0.2 %    Comment: Performed at [facility], [address], [place], [place] [address]    CT ABDOMEN PELVIS W CONTRAST  Result Date: 09/05/2019 CLINICAL DATA:  Abdominal pain, vomiting EXAM: CT ABDOMEN AND PELVIS WITH CONTRAST TECHNIQUE: Multidetector CT imaging of the abdomen and pelvis was performed using the standard protocol following bolus administration of intravenous contrast. CONTRAST:  OMNIPAQUE IOHEXOL 300 MG/ML  SOLN COMPARISON:  08/04/2013 FINDINGS: Lower chest: Moderate right pleural effusion is present with mild atelectasis within the lung bases bilaterally,  right greater than left. Moderate coronary artery calcification. Global cardiac size within normal limits. Hepatobiliary: Cholelithiasis noted. The gallbladder is distended, however common there is no pericholecystic inflammatory change identified. Liver is unremarkable. No intra or extrahepatic biliary ductal dilation. Pancreas: Unremarkable Spleen: Unremarkable Adrenals/Urinary Tract: The adrenal glands are unremarkable. Simple cortical cyst noted within the left kidney. Mild asymmetric cortical atrophy of the right kidney. The kidneys are otherwise unremarkable. The bladder is largely decompressed and is unremarkable. Stomach/Bowel: There is a complete distal small bowel obstruction identified with a point of transition noted within the right mid abdomen on axial image # 59/2, coronal image # 48/4, and sagittal image # 66/5. There is no associated mass or fluid collection in this region. The distal small bowel is decompressed. The more proximal small bowel demonstrates fluid distension. All loops of bowel demonstrate normal enhancement. There is no pneumatosis or free intraperitoneal gas. Mild mesenteric edema is noted within the right mid abdomen, however, there is no free intraperitoneal fluid or loculated intra-abdominal fluid collections. The colon is unremarkable. The appendix is not visualized and is likely absent. Vascular/Lymphatic: No pathologic adenopathy within the abdomen and pelvis. The abdominal vasculature is age-appropriate with moderate aortoiliac atherosclerotic calcification. No aneurysm. Reproductive: Lobulated appearance of the a uterus with coarse calcifications is in keeping with multiple uterine fibroids. The ovaries are unremarkable. No adnexal masses. Other: The rectum is unremarkable. Musculoskeletal: No acute bone  abnormality. IMPRESSION: Distal small bowel obstruction, presumably due to an adhesion, with moderate mesenteric edema identified. No evidence of bowel ischemia. No  perforation. Aortic Atherosclerosis (ICD10-I70.0). Electronically Signed   By: Helyn Numbers MD   On: 09/05/2019 17:50   DG Abd Portable 1 View  Result Date: 09/05/2019 CLINICAL DATA:  85 year old female status post NG placement. EXAM: PORTABLE ABDOMEN - 1 VIEW COMPARISON:  Thirty radiograph dated 09/05/2019. FINDINGS: Partially visualized enteric tube extends to the level of the GE junction and fold back on itself with tip in the distal esophagus. Recommend retraction and repositioning. There is distended stomach as well as mildly distended loops of small bowel measuring up to 3.5 cm. IMPRESSION: 1. Enteric tube folded back on itself with tip in the distal esophagus. Recommend retraction and repositioning. 2. Distended stomach and mildly distended small bowel loops measuring up to 3.5 cm. Electronically Signed   By: Elgie Collard M.D.   On: 09/05/2019 21:37   DG Abd Portable 1 View  Result Date: 09/05/2019 CLINICAL DATA:  Enteric catheter placement EXAM: PORTABLE ABDOMEN - 1 VIEW COMPARISON:  08/13/2013, 09/05/2019 FINDINGS: Frontal view of the lower chest and upper abdomen demonstrates an enteric catheter coiled back upon itself, overlying the gastric fundus. Diffuse gaseous distention of the stomach and small bowel again noted lung bases are clear. IMPRESSION: 1. Enteric catheter coiled over the gastric fundus. 2. Persistent small bowel obstruction. Electronically Signed   By: Sharlet Salina M.D.   On: 09/05/2019 20:24    Review of Systems  Constitutional: Negative.   HENT: Negative.   Eyes: Negative.   Respiratory: Negative.   Cardiovascular: Negative.   Gastrointestinal: Positive for abdominal pain, nausea and vomiting.  Endocrine: Negative.   Genitourinary: Negative.   Musculoskeletal: Negative.   Skin: Negative.   Allergic/Immunologic: Negative.   Neurological: Negative.   Hematological: Negative.   Psychiatric/Behavioral: Positive for confusion.   Blood pressure 110/70, pulse  73, temperature 98.2 F (36.8 C), temperature source Oral, resp. rate 18, height  (1.6 m), weight 54.4 kg, SpO2 95 %. Physical Exam Constitutional:      General: She is not in acute distress.    Appearance: She is well-developed. She is obese. She is not ill-appearing.  HENT:     Head: Normocephalic and atraumatic.     Right Ear: External ear normal.     Left Ear: External ear normal.     Nose: Nose normal.  Eyes:     General: No scleral icterus.    Extraocular Movements: Extraocular movements intact.     Conjunctiva/sclera: Conjunctivae normal.     Pupils: Pupils are equal, round, and reactive to light.  Cardiovascular:     Rate and Rhythm: Normal rate and regular rhythm.     Heart sounds: Normal heart sounds.     Comments: No pitting edema of lower extremities Pulmonary:     Effort: Pulmonary effort is normal. No respiratory distress.     Breath sounds: Normal breath sounds.  Abdominal:     Palpations: Abdomen is soft.     Tenderness: There is no abdominal tenderness.     Comments: There is mild distention  Musculoskeletal:     Cervical back: Normal range of motion and neck supple. No tenderness.  Lymphadenopathy:     Comments: No palpable groin or cervical lymphadenopathy  Skin:    General: Skin is warm and dry.     Findings: No rash.  Neurological:     General: No focal deficit  present.     Mental Status: She is alert.     Comments: She seems slightly disoriented to the situation and her history  Psychiatric:        Mood and Affect: Mood normal.        Behavior: Behavior normal.     Assessment/Plan: The patient appears to have a small bowel obstruction likely secondary to adhesions from prior surgery.  At this point I would agree with medical admission to the hospital.  We will try to place an NG tube for bowel rest and decompression.  If she is able to decompress overnight then we will start the small bowel protocol in the morning.  We will follow her closely  with you.  Chevis Pretty III 09/06/2019, 6:55 AM

## 2019-09-06 NOTE — Progress Notes (Signed)
Progress Note: General Surgery Service   Chief Complaint/Subjective: NG placed but then had nasal bleeding treated with afrin and removal of NG. Nauseated this morning, no flatus or BM, some abdominal pain  Objective: Vital signs in last 24 hours: Temp:  [98.2 F (36.8 C)-98.5 F (36.9 C)] 98.2 F (36.8 C) (07/15 0513) Pulse Rate:  [73-101] 73 (07/15 0513) Resp:  [16-20] 18 (07/15 0513) BP: (110-174)/(70-103) 110/70 (07/15 0513) SpO2:  [94 %-99 %] 95 % (07/15 0513) Weight:  [54.4 kg] 54.4 kg (07/14 1519) Last BM Date: 09/05/19  Intake/Output from previous day: 07/14 0701 - 07/15 0700 In: 205.7 [I.V.:205.7] Out: 0  Intake/Output this shift: No intake/output data recorded.  Gen: NAD  Resp: nonlabored  Card: RRR  Abd: soft, distended, nontener  Lab Results: CBC  Recent Labs    09/05/19 1600 09/06/19 0226  WBC 6.4 6.6  HGB 15.3* 13.5  HCT 47.3* 42.8  PLT 158 143*   BMET Recent Labs    09/05/19 1600 09/06/19 0226  NA 142 140  K 3.7 3.6  CL 105 105  CO2 21* 23  GLUCOSE 162* 154*  BUN 19 19  CREATININE 0.68 0.68  CALCIUM 10.4* 9.2   PT/INR No results for input(s): LABPROT, INR in the last 72 hours. ABG No results for input(s): PHART, HCO3 in the last 72 hours.  Invalid input(s): PCO2, PO2  Anti-infectives: Anti-infectives (From admission, onward)   None      Medications: Scheduled Meds: . enoxaparin (LOVENOX) injection  40 mg Subcutaneous QHS  . insulin aspart  0-9 Units Subcutaneous TID WC  . sodium chloride flush  10-40 mL Intracatheter Q12H  . sodium chloride flush  3 mL Intravenous Once   Continuous Infusions: . lactated ringers 100 mL/hr at 09/06/19 0105   PRN Meds:.ketorolac, ondansetron **OR** ondansetron (ZOFRAN) IV, sodium chloride flush  Assessment/Plan: 85 yo female with SBO likely 2/2 adhesive disease -Radiology for NG tube insertion -NPO -pain control -once NG in will start SB protocol    LOS: 1 day   Rodman Pickle, MD [phone] [facility]

## 2019-09-07 ENCOUNTER — Inpatient Hospital Stay (HOSPITAL_COMMUNITY): Payer: Medicare Other | Admitting: Anesthesiology

## 2019-09-07 ENCOUNTER — Encounter (HOSPITAL_COMMUNITY)
Admission: EM | Disposition: A | Discharge status: Home / Self Care | Payer: Self-pay | Source: Home / Self Care | Attending: Internal Medicine

## 2019-09-07 ENCOUNTER — Encounter (HOSPITAL_COMMUNITY): Payer: Self-pay | Admitting: Internal Medicine

## 2019-09-07 ENCOUNTER — Inpatient Hospital Stay (HOSPITAL_COMMUNITY): Payer: Medicare Other

## 2019-09-07 LAB — BASIC METABOLIC PANEL
Anion gap: 12 (ref 5–15)
BUN: 18 mg/dL (ref 8–23)
CO2: 23 mmol/L (ref 22–32)
Calcium: 9 mg/dL (ref 8.9–10.3)
Chloride: 106 mmol/L (ref 98–111)
Creatinine, Ser: 0.7 mg/dL (ref 0.44–1.00)
GFR calc Af Amer: >60 mL/min (ref >60)
GFR calc non Af Amer: >60 mL/min (ref >60)
Glucose, Bld: 136 mg/dL — ABNORMAL HIGH (ref 70–99)
Potassium: 3.6 mmol/L (ref 3.5–5.1)
Sodium: 141 mmol/L (ref 135–145)

## 2019-09-07 LAB — GLUCOSE, CAPILLARY
Glucose-Capillary: 104 mg/dL — ABNORMAL HIGH (ref 70–99)
Glucose-Capillary: 113 mg/dL — ABNORMAL HIGH (ref 70–99)
Glucose-Capillary: 115 mg/dL — ABNORMAL HIGH (ref 70–99)
Glucose-Capillary: 131 mg/dL — ABNORMAL HIGH (ref 70–99)
Glucose-Capillary: 135 mg/dL — ABNORMAL HIGH (ref 70–99)

## 2019-09-07 LAB — CBC
HCT: 41.3 % (ref 36.0–46.0)
Hemoglobin: 13.4 g/dL (ref 12.0–15.0)
MCH: 30.7 pg (ref 26.0–34.0)
MCHC: 32.4 g/dL (ref 30.0–36.0)
MCV: 94.5 fL (ref 80.0–100.0)
Platelets: 130 10*3/uL — ABNORMAL LOW (ref 150–400)
RBC: 4.37 MIL/uL (ref 3.87–5.11)
RDW: 13.6 % (ref 11.5–15.5)
WBC: 4.3 10*3/uL (ref 4.0–10.5)
nRBC: 0 % (ref 0.0–0.2)

## 2019-09-07 SURGERY — CANCELLED PROCEDURE
Anesthesia: General

## 2019-09-07 MED ORDER — MORPHINE SULFATE (PF) 2 MG/ML IV SOLN
1.0000 mg | INTRAVENOUS | Status: DC | PRN
  Administered 2019-09-07: 1 mg via INTRAVENOUS
  Filled 2019-09-07: qty 1

## 2019-09-07 MED ORDER — ONDANSETRON HCL 4 MG/2ML IJ SOLN
INTRAMUSCULAR | Status: DC | PRN
  Administered 2019-09-07: 4 mg via INTRAVENOUS

## 2019-09-07 MED ORDER — LIDOCAINE HCL URETHRAL/MUCOSAL 2 % EX GEL
1.0000 "application " | Freq: Once | CUTANEOUS | Status: AC
  Administered 2019-09-07: 1 via TOPICAL

## 2019-09-07 MED ORDER — PROPOFOL 10 MG/ML IV BOLUS
INTRAVENOUS | Status: DC | PRN
  Administered 2019-09-07: 100 mg via INTRAVENOUS

## 2019-09-07 MED ORDER — SUCCINYLCHOLINE CHLORIDE 20 MG/ML IJ SOLN
INTRAMUSCULAR | Status: DC | PRN
  Administered 2019-09-07: 100 mg via INTRAVENOUS

## 2019-09-07 MED ORDER — LIDOCAINE 2% (20 MG/ML) 5 ML SYRINGE
INTRAMUSCULAR | Status: DC | PRN
  Administered 2019-09-07: 60 mg via INTRAVENOUS

## 2019-09-07 MED ORDER — DIATRIZOATE MEGLUMINE & SODIUM 66-10 % PO SOLN
90.0000 mL | Freq: Once | ORAL | Status: AC
  Administered 2019-09-08: 90 mL via NASOGASTRIC
  Filled 2019-09-07: qty 90

## 2019-09-07 MED ORDER — PROMETHAZINE HCL 25 MG/ML IJ SOLN
6.2500 mg | Freq: Four times a day (QID) | INTRAMUSCULAR | Status: DC | PRN
  Administered 2019-09-07 – 2019-09-10 (×4): 6.25 mg via INTRAVENOUS
  Filled 2019-09-07 (×4): qty 1

## 2019-09-07 MED ORDER — HYDRALAZINE HCL 20 MG/ML IJ SOLN
INTRAMUSCULAR | Status: AC
  Filled 2019-09-07: qty 1

## 2019-09-07 NOTE — Op Note (Signed)
Preoperative diagnosis: SBO  Postoperative diagnosis: same   Procedure: NG insertion  Surgeon: Feliciana Rossetti, M.D.  Asst: none  Anesthesia: general  Indications for procedure: Bonnie Briggs is a 85 y.o. year old female with symptoms of nausea, vomiting, and abdominal pain with work up consistent with small bowel obstruction. She had an initial NG tube inserted but it was removed due to concern of epistaxis. A second attempt was made by radiology without success. She continued to have vomiting so after afrin was given for 16 h she was brought to endo suite for attempt with anesthesia  Description of procedure: The patient was brought into the operative suite. Anesthesia was administered with General endotracheal anesthesia. WHO checklist was applied. The patient was then placed in supine position. The area was prepped and draped in the usual sterile fashion.  Next, 2% viscous lidocaine was inserted into each nostril. A 14 Fr NG was inserted into the left nares. It was seen passing into the posterior pharynx and manual palpation was used to help guide it into the esophagus. Once advanced beyond 50 cm dark green fluid was seen in the tube and it was connected to suction with 400 ml of green bilious fluid immediately removed. The tube was sutured to the nasal bridge with a 0 prolene. The patient awoke from anesthesia and was brought to PACU in stable condition.  Findings: NG in stomach, 400 ml of fluid drained from stomach  Specimen: noen  Implant: 14 Fr NG tube   Blood loss: minimal  Local anesthesia: 2% viscous lidocaine  Complications: none  Feliciana Rossetti, M.D. General, Bariatric, & Minimally Invasive Surgery [facility]

## 2019-09-07 NOTE — Anesthesia Procedure Notes (Signed)
Procedure Name: Intubation Performed by: Gean Maidens, CRNA Pre-anesthesia Checklist: Patient identified Patient Re-evaluated:Patient Re-evaluated prior to induction Oxygen Delivery Method: Circle system utilized Preoxygenation: Pre-oxygenation with 100% oxygen Induction Type: IV induction and Rapid sequence Laryngoscope Size: Mac Grade View: Grade I Tube type: Oral Tube size: 7.0 mm Number of attempts: 1 Airway Equipment and Method: Stylet Placement Confirmation: ETT inserted through vocal cords under direct vision,  positive ETCO2 and breath sounds checked- equal and bilateral Secured at: 22 cm Tube secured with: Tape Dental Injury: Teeth and Oropharynx as per pre-operative assessment

## 2019-09-07 NOTE — Progress Notes (Signed)
ngt remains in place to right nare with liws, sutured in place, pt given instruction to be careful with tube and not to pull at it, verbal acknowledgement noted, safety maintained, pt with small amount of pink to red tinged secretions noted from mouth, tissues at bedside

## 2019-09-07 NOTE — Progress Notes (Signed)
Progress Note: General Surgery Service   Chief Complaint/Subjective: Vomited 2 times overnight, flatus 1 time overnight, continued epigastric abdominal pain  Objective: Vital signs in last 24 hours: Temp:  [98.6 F (37 C)-98.8 F (37.1 C)] 98.6 F (37 C) (07/16 0601) Pulse Rate:  [66-90] 88 (07/16 0601) Resp:  [14-18] 16 (07/16 0601) BP: (116-170)/(68-87) 170/87 (07/16 0601) SpO2:  [94 %-100 %] 94 % (07/16 0601) Last BM Date: 09/05/19  Intake/Output from previous day: 07/15 0701 - 07/16 0700 In: 1891.4 [I.V.:1891.4] Out: 500 [Urine:500] Intake/Output this shift: No intake/output data recorded.  Gen: NAD  Resp: nonlabored  Card: RRR  Abd: soft, distended, NT  Lab Results: CBC  Recent Labs    09/06/19 0226 09/07/19 0446  WBC 6.6 4.3  HGB 13.5 13.4  HCT 42.8 41.3  PLT 143* 130*   BMET Recent Labs    09/06/19 0226 09/07/19 0446  NA 140 141  K 3.6 3.6  CL 105 106  CO2 23 23  GLUCOSE 154* 136*  BUN 19 18  CREATININE 0.68 0.70  CALCIUM 9.2 9.0   PT/INR No results for input(s): LABPROT, INR in the last 72 hours. ABG No results for input(s): PHART, HCO3 in the last 72 hours.  Invalid input(s): PCO2, PO2  Anti-infectives: Anti-infectives (From admission, onward)   None      Medications: Scheduled Meds: . enoxaparin (LOVENOX) injection  40 mg Subcutaneous QHS  . insulin aspart  0-9 Units Subcutaneous Q6H  . oxymetazoline  1 spray Each Nare BID  . sodium chloride flush  10-40 mL Intracatheter Q12H  . sodium chloride flush  3 mL Intravenous Once   Continuous Infusions: . lactated ringers 100 mL/hr at 09/07/19 0308   PRN Meds:.hydrALAZINE, ketorolac, morphine injection, ondansetron **OR** ondansetron (ZOFRAN) IV, promethazine, sodium chloride flush  Assessment/Plan: 85 yo female with SBO, XR with persistent dilatation and vomiting overnight -bowel rest -will reattempt NG tube with anesthesia today -pain control   LOS: 2 days   Rodman Pickle, MD [phone] [facility]

## 2019-09-07 NOTE — Anesthesia Preprocedure Evaluation (Addendum)
Anesthesia Evaluation  Patient identified by MRN, date of birth, ID band Patient awake    Reviewed: Allergy & Precautions, NPO status , Patient's Chart, lab work & pertinent test results  History of Anesthesia Complications Negative for: history of anesthetic complications  Airway Mallampati: II  TM Distance: >3 FB Neck ROM: Full    Dental  (+) Upper Dentures, Lower Dentures   Pulmonary neg pulmonary ROS,    Pulmonary exam normal        Cardiovascular hypertension, Pt. on medications  Rhythm:Regular Rate:Normal     Neuro/Psych negative neurological ROS  negative psych ROS   GI/Hepatic Neg liver ROS,  SBO with vomitting    Endo/Other  diabetes, Type 2, Oral Hypoglycemic Agents  Renal/GU negative Renal ROS     Musculoskeletal negative musculoskeletal ROS (+)   Abdominal   Peds  Hematology negative hematology ROS (+)   Anesthesia Other Findings Covid test negative   Reproductive/Obstetrics                            Anesthesia Physical Anesthesia Plan  ASA: III  Anesthesia Plan: General   Post-op Pain Management:    Induction: Intravenous, Rapid sequence and Cricoid pressure planned  PONV Risk Score and Plan: 3 and Treatment may vary due to age or medical condition and Ondansetron  Airway Management Planned: Oral ETT  Additional Equipment: None  Intra-op Plan:   Post-operative Plan: Extubation in OR  Informed Consent: I have reviewed the patients History and Physical, chart, labs and discussed the procedure including the risks, benefits and alternatives for the proposed anesthesia with the patient or authorized representative who has indicated his/her understanding and acceptance.     Dental advisory given  Plan Discussed with: CRNA and Anesthesiologist  Anesthesia Plan Comments:        Anesthesia Quick Evaluation

## 2019-09-07 NOTE — Progress Notes (Signed)
PROGRESS NOTE    Tekeyah Santiago  WIO:[id number] DOB: 1934-06-09 DOA: 09/05/2019 PCP: Patient, No Pcp Per   Chef Complaints: nausea vomiting and abd pain x 24 hrs  Brief Narrative: 85 y.o. female with medical history significant of previous small bowel obstruction in 2015 status post exploratory laparotomy, diabetes, hypertension who is visiting her children from [place] brought in today secondary to abdominal pain with nausea and vomiting.  This has been going on for the last 24 hours.  The vomitus is clear nonbloody and nonbilious.  She is also has not had any bowel movement.  No gas passage today.  Denied any fever.  Patient took some Tums.  She ate some chicken of from [employer] and thought that was the cause.  She was seen in the ER and initial work-up now showing small bowel obstruction.  This appears to be similar to what she had back in 2015.  No hematemesis no melena no bright red blood per rectum.  Patient being admitted to the hospital was small bowel obstruction probably secondary to previous surgery.  General surgery consulted to follow along..  ED Course: Temperature 98.4 blood pressure 116/88 pulse 96 respiratory rate of 20 oxygen sat 99% on room air.  CBC largely within normal except for hemoglobin 15.3.  Chemistry largely within normal calcium 10.4 CO2 21.  Glucose 162.  CT abdomen pelvis shows distal small bowel obstruction presumably due to an adhesion with moderate mesenteric edema.  Patient being admitted and NG tube inserted.  Subjective:  Patient vomited x2 overnight  flatus x1.  Continues to complain of abdominal pain Not controlled by Toradol.'s  Seen by surgery planning to attempt NG by anesthesia.    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 Continue current plan with n.p.o., IV fluids, add  morphine iv for pain control, add Phenergan for refractory nausea vomiting as pain/vomiting is uncontrolled by Toradol and zofran. Cont plan as per surgery  Dehydration: Due to #1. Cont ivf  HTN on valsartan, diamox and diltiazem at home: Blood pressure poorly controlled,  likely from pain bowel obstruction.  Increase hydration to 10 mg as needed basis.   DM/HLD on metformin and lipitor at home: P.o. meds on hold. Cont ssi for now/  Keep on sliding scale insulin. HbA1c controlled at 6.5 on 09/06/19 Recent Labs  Lab 09/06/19 0722 09/06/19 1221 09/06/19 1736 09/06/19 2352 09/07/19 0558  GLUCAP 129* 116* 118* 115* 135*   Hx of Stroke/memory issues: on asa 325 mg, statins.  Due to nasal bleeding and Bowel obstruction unable to use po meds.  DVT prophylaxis: enoxaparin (LOVENOX) injection 40 mg Start: 09/06/19 0100 Code Status:FULL Family Communication: plan of care discussed with patient and her family at bedside today too.  Status is: Inpatient  Remains inpatient appropriate because:IV treatments appropriate due to intensity of illness or inability to take PO and Inpatient level of care appropriate due to severity of illness   Dispo: The patient is from: Home from [facility]              Anticipated d/c is to: Home              Anticipated d/c date is: > 3 days              Patient currently is not medically stable to d/c.   Nutrition: Diet Order  Diet NPO time specified  Diet effective now                       Body mass index is 21.26 kg/m.  Consultants:see note  Procedures:see note Microbiology:see note  Medications: Scheduled Meds: . enoxaparin (LOVENOX) injection  40 mg Subcutaneous QHS  . insulin aspart  0-9 Units Subcutaneous Q6H  . oxymetazoline  1 spray Each Nare BID  . sodium chloride flush  10-40 mL Intracatheter Q12H  . sodium chloride flush  3 mL Intravenous Once   Continuous Infusions: . lactated ringers 100 mL/hr at 09/07/19 0308     Antimicrobials: Anti-infectives (From admission, onward)   None       Objective: Vitals: Today's Vitals   09/07/19 0427 09/07/19 0457 09/07/19 0601 09/07/19 0846  BP:   (!) 170/87   Pulse:   88   Resp:   16   Temp:   98.6 F (37 C)   TempSrc:   Oral   SpO2:   94%   Weight:      Height:      PainSc: 8  Asleep  8     Intake/Output Summary (Last 24 hours) at 09/07/2019 0927 Last data filed at 09/07/2019 0600 Gross per 24 hour  Intake 1891.35 ml  Output 500 ml  Net 1391.35 ml   Filed Weights   09/05/19 1519  Weight: 54.4 kg   Weight change:    Intake/Output from previous day: 07/15 0701 - 07/16 0700 In: 1891.4 [I.V.:1891.4] Out: 500 [Urine:500] Intake/Output this shift: No intake/output data recorded.  Examination:  General exam: AAOx3, in pain, mild distress, uncomfortable HEENT:Oral mucosa moist, Ear/Nose WNL grossly, dentition normal. Respiratory system: bilaterally clear,no wheezing or crackles,no use of accessory muscle Cardiovascular system: S1 & S2 +, No JVD,. Gastrointestinal system: Abdomen is soft, distended, bowel sounds sluggish, tenderness + diffusely Nervous System:Alert, awake, moving extremities and grossly nonfocal Extremities: No edema, distal peripheral pulses palpable.  Skin: No rashes,no icterus. MSK: Normal muscle bulk,tone, power  Data Reviewed: I have personally reviewed following labs and imaging studies CBC: Recent Labs  Lab 09/05/19 1600 09/06/19 0226 09/07/19 0446  WBC 6.4 6.6 4.3  HGB 15.3* 13.5 13.4  HCT 47.3* 42.8 41.3  MCV 95.4 95.3 94.5  PLT 158 143* 130*   Basic Metabolic Panel: Recent Labs  Lab 09/05/19 1600 09/06/19 0226 09/07/19 0446  NA 142 140 141  K 3.7 3.6 3.6  CL 105 105 106  CO2 21* 23 23  GLUCOSE 162* 154* 136*  BUN 19 19 18   CREATININE 0.68 0.68 0.70  CALCIUM 10.4* 9.2 9.0   GFR: Estimated Creatinine Clearance: 44.8 mL/min (by C-G formula based on SCr of 0.7 mg/dL). Liver Function  Tests: Recent Labs  Lab 09/05/19 1600 09/06/19 0226  AST 17 16  ALT 15 13  ALKPHOS 70 59  BILITOT 0.7 0.6  PROT 8.0 6.5  ALBUMIN 4.4 3.6   Recent Labs  Lab 09/05/19 1600  LIPASE 94*   No results for input(s): AMMONIA in the last 168 hours. Coagulation Profile: No results for input(s): INR, PROTIME in the last 168 hours. Cardiac Enzymes: No results for input(s): CKTOTAL, CKMB, CKMBINDEX, TROPONINI in the last 168 hours. BNP (last 3 results) No results for input(s): PROBNP in the last 8760 hours. HbA1C: Recent Labs    09/06/19 0042  HGBA1C 6.5*   CBG: Recent Labs  Lab 09/06/19 0722 09/06/19 1221 09/06/19 1736 09/06/19 2352 09/07/19 0558  GLUCAP 129*  116* 118* 115* 135*   Lipid Profile: No results for input(s): CHOL, HDL, LDLCALC, TRIG, CHOLHDL, LDLDIRECT in the last 72 hours. Thyroid Function Tests: No results for input(s): TSH, T4TOTAL, FREET4, T3FREE, THYROIDAB in the last 72 hours. Anemia Panel: No results for input(s): VITAMINB12, FOLATE, FERRITIN, TIBC, IRON, RETICCTPCT in the last 72 hours. Sepsis Labs: No results for input(s): PROCALCITON, LATICACIDVEN in the last 168 hours.  Recent Results (from the past 240 hour(s))  SARS Coronavirus 2 by RT PCR (hospital order, performed in [facility] hospital lab) Nasopharyngeal Nasopharyngeal Swab     Status: None   Collection Time: 09/05/19  9:01 PM   Specimen: Nasopharyngeal Swab  Result Value Ref Range Status   SARS Coronavirus 2 NEGATIVE NEGATIVE Final    Comment: (NOTE) SARS-CoV-2 target nucleic acids are NOT DETECTED.  The SARS-CoV-2 RNA is generally detectable in upper and lower respiratory specimens during the acute phase of infection. The lowest concentration of SARS-CoV-2 viral copies this assay can detect is 250 copies / mL. A negative result does not preclude SARS-CoV-2 infection and should not be used as the sole basis for treatment or other patient management decisions.  A negative result may  occur with improper specimen collection / handling, submission of specimen other than nasopharyngeal swab, presence of viral mutation(s) within the areas targeted by this assay, and inadequate number of viral copies (<250 copies / mL). A negative result must be combined with clinical observations, patient history, and epidemiological information.  Fact Sheet for Patients:   BoilerBrush.com.cy  Fact Sheet for Healthcare Providers: https://pope.com/  This test is not yet approved or  cleared by the [place] FDA and has been authorized for detection and/or diagnosis of SARS-CoV-2 by FDA under an Emergency Use Authorization (EUA).  This EUA will remain in effect (meaning this test can be used) for the duration of the COVID-19 declaration under Section 564(b)(1) of the Act, 21 U.S.C. section 360bbb-3(b)(1), unless the authorization is terminated or revoked sooner.  Performed at [facility], [address], [place], [place] [address]       Radiology Studies: CT ABDOMEN PELVIS W CONTRAST  Result Date: 09/05/2019 CLINICAL DATA:  Abdominal pain, vomiting EXAM: CT ABDOMEN AND PELVIS WITH CONTRAST TECHNIQUE: Multidetector CT imaging of the abdomen and pelvis was performed using the standard protocol following bolus administration of intravenous contrast. CONTRAST:  OMNIPAQUE IOHEXOL 300 MG/ML  SOLN COMPARISON:  08/04/2013 FINDINGS: Lower chest: Moderate right pleural effusion is present with mild atelectasis within the lung bases bilaterally, right greater than left. Moderate coronary artery calcification. Global cardiac size within normal limits. Hepatobiliary: Cholelithiasis noted. The gallbladder is distended, however common there is no pericholecystic inflammatory change identified. Liver is unremarkable. No intra or extrahepatic biliary ductal dilation. Pancreas: Unremarkable Spleen: Unremarkable Adrenals/Urinary  Tract: The adrenal glands are unremarkable. Simple cortical cyst noted within the left kidney. Mild asymmetric cortical atrophy of the right kidney. The kidneys are otherwise unremarkable. The bladder is largely decompressed and is unremarkable. Stomach/Bowel: There is a complete distal small bowel obstruction identified with a point of transition noted within the right mid abdomen on axial image # 59/2, coronal image # 48/4, and sagittal image # 66/5. There is no associated mass or fluid collection in this region. The distal small bowel is decompressed. The more proximal small bowel demonstrates fluid distension. All loops of bowel demonstrate normal enhancement. There is no pneumatosis or free intraperitoneal gas. Mild mesenteric edema is noted within the right mid  abdomen, however, there is no free intraperitoneal fluid or loculated intra-abdominal fluid collections. The colon is unremarkable. The appendix is not visualized and is likely absent. Vascular/Lymphatic: No pathologic adenopathy within the abdomen and pelvis. The abdominal vasculature is age-appropriate with moderate aortoiliac atherosclerotic calcification. No aneurysm. Reproductive: Lobulated appearance of the a uterus with coarse calcifications is in keeping with multiple uterine fibroids. The ovaries are unremarkable. No adnexal masses. Other: The rectum is unremarkable. Musculoskeletal: No acute bone abnormality. IMPRESSION: Distal small bowel obstruction, presumably due to an adhesion, with moderate mesenteric edema identified. No evidence of bowel ischemia. No perforation. Aortic Atherosclerosis (ICD10-I70.0). Electronically Signed   By: Helyn Numbers MD   On: 09/05/2019 17:50   DG Abd Portable 1V  Result Date: 09/07/2019 CLINICAL DATA:  85 year old female with history of small-bowel obstruction. EXAM: PORTABLE ABDOMEN - 1 VIEW COMPARISON:  09/05/2019. FINDINGS: Multiple dilated loops of small bowel are again noted in the central abdomen  measuring up to 5.3 cm in diameter, increased compared to the prior examination. There is a paucity of colonic and rectal gas and stool. No frank pneumoperitoneum noted on this single supine view. IMPRESSION: 1. Findings are compatible with persistent small bowel obstruction, as above. Electronically Signed   By: Trudie Reed M.D.   On: 09/07/2019 07:24   DG Abd Portable 1 View  Result Date: 09/05/2019 CLINICAL DATA:  85 year old female status post NG placement. EXAM: PORTABLE ABDOMEN - 1 VIEW COMPARISON:  Thirty radiograph dated 09/05/2019. FINDINGS: Partially visualized enteric tube extends to the level of the GE junction and fold back on itself with tip in the distal esophagus. Recommend retraction and repositioning. There is distended stomach as well as mildly distended loops of small bowel measuring up to 3.5 cm. IMPRESSION: 1. Enteric tube folded back on itself with tip in the distal esophagus. Recommend retraction and repositioning. 2. Distended stomach and mildly distended small bowel loops measuring up to 3.5 cm. Electronically Signed   By: Elgie Collard M.D.   On: 09/05/2019 21:37   DG Abd Portable 1 View  Result Date: 09/05/2019 CLINICAL DATA:  Enteric catheter placement EXAM: PORTABLE ABDOMEN - 1 VIEW COMPARISON:  08/13/2013, 09/05/2019 FINDINGS: Frontal view of the lower chest and upper abdomen demonstrates an enteric catheter coiled back upon itself, overlying the gastric fundus. Diffuse gaseous distention of the stomach and small bowel again noted lung bases are clear. IMPRESSION: 1. Enteric catheter coiled over the gastric fundus. 2. Persistent small bowel obstruction. Electronically Signed   By: Sharlet Salina M.D.   On: 09/05/2019 20:24   DG Basil Dess Tube Plc W/Fl W/Rad  Result Date: 09/06/2019 CLINICAL DATA:  Small-bowel obstruction. EXAM: NASO G TUBE PLACEMENT WITH FL AND WITH RAD FLUOROSCOPY TIME:  Fluoroscopy Time:  0 Radiation Exposure Index (if provided by the fluoroscopic  device): 0 mGy Number of Acquired Spot Images: None COMPARISON:  CT abdomen/pelvis 09/05/2019, abdominal radiograph 09/05/2019. FINDINGS: NG tube placement was attempted. However, the NG tube did not pass beyond the level of the nasal passage despite multiple attempts via the right and left nares. Fluoroscopy was not utilized. IMPRESSION: Unsuccessful NG tube placement. The NG tube did not pass beyond the level of the nasal passage despite multiple attempts via the right and left nares. Electronically Signed   By: Jackey Loge DO   On: 09/06/2019 12:33     LOS: 2 days   Lanae Boast, MD [facility]  09/07/2019, 9:27 AM

## 2019-09-07 NOTE — Transfer of Care (Signed)
Immediate Anesthesia Transfer of Care Note  Patient: Bonnie Briggs  Procedure(s) Performed: ESOPHAGOGASTRODUODENOSCOPY (EGD) WITH PROPOFOL (N/A )  Patient Location: PACU  Anesthesia Type:General  Level of Consciousness: sedated, patient cooperative and responds to stimulation  Airway & Oxygen Therapy: Patient Spontanous Breathing and Patient connected to face mask oxygen  Post-op Assessment: Report given to RN and Post -op Vital signs reviewed and stable  Post vital signs: Reviewed and stable  Last Vitals:  Vitals Value Taken Time  BP    Temp    Pulse 101 09/07/19 1414  Resp 23 09/07/19 1414  SpO2 96 % 09/07/19 1414  Vitals shown include unvalidated device data.  Last Pain:  Vitals:   09/07/19 1316  TempSrc: Oral  PainSc: 0-No pain      Patients Stated Pain Goal: 1 (09/07/19 0846)  Complications: No complications documented.

## 2019-09-07 NOTE — Care Management Important Message (Signed)
Important Message  Patient Details IM Letter presented to the Patient Name: Bonnie Briggs MRN: [id number] Date of Birth: Jan 07, 1935   Medicare Important Message Given:        [name] 09/07/2019, 2:25 PM

## 2019-09-07 NOTE — Anesthesia Postprocedure Evaluation (Signed)
Anesthesia Post Note  Patient: Bonnie Briggs  Procedure(s) Performed: CANCELLED PROCEDURE     Patient location during evaluation: PACU Anesthesia Type: General Level of consciousness: awake and alert Pain management: pain level controlled Vital Signs Assessment: post-procedure vital signs reviewed and stable Respiratory status: spontaneous breathing, nonlabored ventilation and respiratory function stable Cardiovascular status: blood pressure returned to baseline and stable Postop Assessment: no apparent nausea or vomiting Anesthetic complications: no   No complications documented.  Last Vitals:  Vitals:   09/07/19 1415 09/07/19 1420  BP: (!) 202/90 (!) 200/93  Pulse: (!) 101 100  Resp: (!) 23 (!) 22  Temp:    SpO2: 96% 100%    Last Pain:  Vitals:   09/07/19 1316  TempSrc: Oral  PainSc: 0-No pain                 Beryle Lathe

## 2019-09-08 ENCOUNTER — Encounter (HOSPITAL_COMMUNITY): Payer: Self-pay | Admitting: Internal Medicine

## 2019-09-08 ENCOUNTER — Inpatient Hospital Stay (HOSPITAL_COMMUNITY): Payer: Medicare Other

## 2019-09-08 LAB — BASIC METABOLIC PANEL
Anion gap: 11 (ref 5–15)
BUN: 20 mg/dL (ref 8–23)
CO2: 27 mmol/L (ref 22–32)
Calcium: 8.7 mg/dL — ABNORMAL LOW (ref 8.9–10.3)
Chloride: 104 mmol/L (ref 98–111)
Creatinine, Ser: 0.68 mg/dL (ref 0.44–1.00)
GFR calc Af Amer: >60 mL/min (ref >60)
GFR calc non Af Amer: >60 mL/min (ref >60)
Glucose, Bld: 104 mg/dL — ABNORMAL HIGH (ref 70–99)
Potassium: 3.3 mmol/L — ABNORMAL LOW (ref 3.5–5.1)
Sodium: 142 mmol/L (ref 135–145)

## 2019-09-08 LAB — CBC
HCT: 40.4 % (ref 36.0–46.0)
Hemoglobin: 13 g/dL (ref 12.0–15.0)
MCH: 30.4 pg (ref 26.0–34.0)
MCHC: 32.2 g/dL (ref 30.0–36.0)
MCV: 94.4 fL (ref 80.0–100.0)
Platelets: 119 10*3/uL — ABNORMAL LOW (ref 150–400)
RBC: 4.28 MIL/uL (ref 3.87–5.11)
RDW: 13.4 % (ref 11.5–15.5)
WBC: 4.8 10*3/uL (ref 4.0–10.5)
nRBC: 0 % (ref 0.0–0.2)

## 2019-09-08 LAB — GLUCOSE, CAPILLARY
Glucose-Capillary: 100 mg/dL — ABNORMAL HIGH (ref 70–99)
Glucose-Capillary: 108 mg/dL — ABNORMAL HIGH (ref 70–99)
Glucose-Capillary: 96 mg/dL (ref 70–99)

## 2019-09-08 MED ORDER — POTASSIUM CHLORIDE 10 MEQ/100ML IV SOLN
10.0000 meq | INTRAVENOUS | Status: AC
  Administered 2019-09-08 (×3): 10 meq via INTRAVENOUS
  Filled 2019-09-08 (×3): qty 100

## 2019-09-08 MED ORDER — LIP MEDEX EX OINT
TOPICAL_OINTMENT | CUTANEOUS | Status: AC
  Filled 2019-09-08: qty 7

## 2019-09-08 MED ORDER — PHENOL 1.4 % MT LIQD
1.0000 | OROMUCOSAL | Status: DC | PRN
  Administered 2019-09-08: 1 via OROMUCOSAL
  Filled 2019-09-08: qty 177

## 2019-09-08 NOTE — Progress Notes (Signed)
1 Day Post-Op   Subjective/Chief Complaint: Pt feels better with NGT No flatus or BM    Objective: Vital signs in last 24 hours: Temp:  [98 F (36.7 C)-98.6 F (37 C)] 98.2 F (36.8 C) (07/17 0603) Pulse Rate:  [92-114] 92 (07/17 0603) Resp:  [18-24] 18 (07/17 0603) BP: (156-216)/(80-112) 168/93 (07/17 0603) SpO2:  [92 %-100 %] 95 % (07/17 0603) Last BM Date: 09/05/19  Intake/Output from previous day: 07/16 0701 - 07/17 0700 In: 2283.9 [I.V.:2283.9] Out: 1480 [Urine:200; Emesis/NG output:1280] Intake/Output this shift: No intake/output data recorded.  General appearance: alert and cooperative Cardio: regular rate and rhythm, S1, S2 normal, no murmur, click, rub or gallop GI: SOFT MINIMAL DISTENTION NO PERITONITIS SCAR NOTED NO HERNIA   Lab Results:  Recent Labs    09/07/19 0446 09/08/19 0550  WBC 4.3 4.8  HGB 13.4 13.0  HCT 41.3 40.4  PLT 130* 119*   BMET Recent Labs    09/07/19 0446 09/08/19 0550  NA 141 142  K 3.6 3.3*  CL 106 104  CO2 23 27  GLUCOSE 136* 104*  BUN 18 20  CREATININE 0.70 0.68  CALCIUM 9.0 8.7*   PT/INR No results for input(s): LABPROT, INR in the last 72 hours. ABG No results for input(s): PHART, HCO3 in the last 72 hours.  Invalid input(s): PCO2, PO2  Studies/Results: DG Abd Portable 1V-Small Bowel Obstruction Protocol-24 hr delay  Result Date: 09/08/2019 CLINICAL DATA:  85 year old female with small bowel obstruction EXAM: PORTABLE ABDOMEN - 1 VIEW COMPARISON:  09/07/2019 FINDINGS: Gas within stomach small bowel and colon. Persistent dilation of small bowel loops in the mid abdomen with a paucity of colonic and rectal gas. Partially imaged gastric tube within the upper abdomen. No unexpected radiopaque foreign body. No unexpected soft tissue density or calcifications. Unremarkable musculoskeletal structures. IMPRESSION: Persisting small bowel obstruction. Gastric tube partially imaged. Electronically Signed   By: Gilmer Mor D.O.    On: 09/08/2019 08:41   DG Abd Portable 1V-Small Bowel Protocol-Position Verification  Result Date: 09/07/2019 CLINICAL DATA:  Nasogastric tube placement. EXAM: PORTABLE ABDOMEN - 1 VIEW COMPARISON:  Radiographs earlier the same date and 09/05/2019. CT 09/05/2019. FINDINGS: 1429 hours. Nasogastric tube tip projects over the mid stomach. There is persistent moderate diffuse small bowel distension with relative decompression of the colon. No supine evidence of free intraperitoneal air. The bones appear unchanged. IMPRESSION: 1. Nasogastric tube tip projects over the mid stomach. 2. Persistent small bowel obstruction. Electronically Signed   By: Carey Bullocks M.D.   On: 09/07/2019 14:58   DG Abd Portable 1V  Result Date: 09/07/2019 CLINICAL DATA:  85 year old female with history of small-bowel obstruction. EXAM: PORTABLE ABDOMEN - 1 VIEW COMPARISON:  09/05/2019. FINDINGS: Multiple dilated loops of small bowel are again noted in the central abdomen measuring up to 5.3 cm in diameter, increased compared to the prior examination. There is a paucity of colonic and rectal gas and stool. No frank pneumoperitoneum noted on this single supine view. IMPRESSION: 1. Findings are compatible with persistent small bowel obstruction, as above. Electronically Signed   By: Trudie Reed M.D.   On: 09/07/2019 07:24   DG Basil Dess Tube Plc W/Fl W/Rad  Result Date: 09/06/2019 CLINICAL DATA:  Small-bowel obstruction. EXAM: NASO G TUBE PLACEMENT WITH FL AND WITH RAD FLUOROSCOPY TIME:  Fluoroscopy Time:  0 Radiation Exposure Index (if provided by the fluoroscopic device): 0 mGy Number of Acquired Spot Images: None COMPARISON:  CT abdomen/pelvis 09/05/2019, abdominal radiograph  09/05/2019. FINDINGS: NG tube placement was attempted. However, the NG tube did not pass beyond the level of the nasal passage despite multiple attempts via the right and left nares. Fluoroscopy was not utilized. IMPRESSION: Unsuccessful NG tube  placement. The NG tube did not pass beyond the level of the nasal passage despite multiple attempts via the right and left nares. Electronically Signed   By: Jackey Loge DO   On: 09/06/2019 12:33    Anti-infectives: Anti-infectives (From admission, onward)   None      Assessment/Plan:  SBO- NGT IN PLACE  Cont NGT and NPO except for ice chips  Ambulate  KUB in AM  FOLLOW LABS  OK TO AMBULATE  If no better in next 48 hours, may need ex lap Discussed with daughter   LOS: 3 days    Dortha Schwalbe MD  09/08/2019

## 2019-09-08 NOTE — Progress Notes (Signed)
PROGRESS NOTE    Bonnie Briggs  JHE:[id number] DOB: 12/23/1934 DOA: 09/05/2019 PCP: Patient, No Pcp Per   Chef Complaints: nausea vomiting and abd pain x 24 hrs  Brief Narrative: 85 y.o. female with medical history significant of previous small bowel obstruction in 2015 status post exploratory laparotomy, diabetes, hypertension who is visiting her children from [place] brought in today secondary to abdominal pain with nausea and vomiting.  This has been going on for the last 24 hours.  The vomitus is clear nonbloody and nonbilious.  She is also has not had any bowel movement.  No gas passage today.  Denied any fever.  Patient took some Tums.  She ate some chicken of from [employer] and thought that was the cause.  She was seen in the ER and initial work-up now showing small bowel obstruction.  This appears to be similar to what she had back in 2015.  No hematemesis no melena no bright red blood per rectum.  Patient being admitted to the hospital was small bowel obstruction probably secondary to previous surgery.  General surgery consulted to follow along..  ED Course: Temperature 98.4 blood pressure 116/88 pulse 96 respiratory rate of 20 oxygen sat 99% on room air.  CBC largely within normal except for hemoglobin 15.3.  Chemistry largely within normal calcium 10.4 CO2 21.  Glucose 162.  CT abdomen pelvis shows distal small bowel obstruction presumably due to an adhesion with moderate mesenteric edema.  Patient being admitted and NG tube inserted.  Subjective:  NGT o/p: 1280 ml, c/o abd pain, no flatus or BM. 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   Dehydration: Due to #1. Cont ivf  Hypokalemia- will replete iv.  HTN on valsartan, diamox and diltiazem at home: Blood pressure poorly controlled,  likely from pain bowel obstruction.  Increase hydration to 10 mg as needed basis.   DM/HLD on metformin and lipitor at home: P.o. meds on hold. Cont ssi for now/  Keep on sliding scale insulin. HbA1c controlled at 6.5 on 09/06/19 Recent Labs  Lab 09/07/19 0558 09/07/19 1203 09/07/19 1741 09/07/19 2350 09/08/19 0600  GLUCAP 135* 113* 131* 104* 100*   Hx of Stroke/memory issues: on asa 325 mg, statins.  Due to nasal bleeding and Bowel obstruction unable to use po meds.  DVT prophylaxis: enoxaparin (LOVENOX) injection 40 mg Start: 09/06/19 0100 Code Status:FULL Family Communication: plan of care discussed with patient and her family at bedside today too.  Status is: Inpatient  Remains inpatient appropriate because:IV treatments appropriate due to intensity of illness or inability to take PO and Inpatient level of care appropriate due to severity of illness for ongoing management of small bowel obstruction   Dispo: The patient is from: Home from [facility]              Anticipated d/c is to: Home              Anticipated d/c date is: > 3 days              Patient currently is not medically stable to d/c.   Nutrition: Diet Order  Diet NPO time specified  Diet effective now                       Body mass index is 21.26 kg/m.  Consultants:see note  Procedures:see note Microbiology:see note  Medications: Scheduled Meds: . enoxaparin (LOVENOX) injection  40 mg Subcutaneous QHS  . insulin aspart  0-9 Units Subcutaneous Q6H  . oxymetazoline  1 spray Each Nare BID  . sodium chloride flush  10-40 mL Intracatheter Q12H  . sodium chloride flush  3 mL Intravenous Once   Continuous Infusions: . lactated ringers 100 mL/hr at 09/08/19 0143    Antimicrobials: Anti-infectives (From admission, onward)   None        Objective: Vitals: Today's Vitals   09/07/19 2131 09/07/19 2155 09/08/19 0603 09/08/19 0810  BP: (!) 167/86  (!) 168/93   Pulse: 98  92   Resp: 18  18   Temp: 98 F (36.7 C)  98.2 F (36.8 C)   TempSrc:      SpO2: 95%  95%   Weight:      Height:      PainSc:  Asleep  1     Intake/Output Summary (Last 24 hours) at 09/08/2019 0946 Last data filed at 09/08/2019 0542 Gross per 24 hour  Intake 2283.86 ml  Output 1480 ml  Net 803.86 ml   Filed Weights   09/05/19 1519  Weight: 54.4 kg   Weight change:    Intake/Output from previous day: 07/16 0701 - 07/17 0700 In: 2283.9 [I.V.:2283.9] Out: 1480 [Urine:200; Emesis/NG output:1280] Intake/Output this shift: No intake/output data recorded.  Examination:  General exam: AAOx3, in mild pain, weak appearing. HEENT:Oral mucosa moist, Ear/Nose WNL grossly, dentition normal. Respiratory system: bilaterally clear,no wheezing or crackles,no use of accessory muscle Cardiovascular system: S1 & S2 +, No JVD,. Gastrointestinal system: Abdomen distended, generalized tenderness mild, bowel sounds present  Nervous System:Alert, awake, moving extremities and grossly nonfocal Extremities: No edema, distal peripheral pulses palpable.  Skin: No rashes,no icterus. MSK: Normal muscle bulk,tone, power    Data Reviewed: I have personally reviewed following labs and imaging studies CBC: Recent Labs  Lab 09/05/19 1600 09/06/19 0226 09/07/19 0446 09/08/19 0550  WBC 6.4 6.6 4.3 4.8  HGB 15.3* 13.5 13.4 13.0  HCT 47.3* 42.8 41.3 40.4  MCV 95.4 95.3 94.5 94.4  PLT 158 143* 130* 119*   Basic Metabolic Panel: Recent Labs  Lab 09/05/19 1600 09/06/19 0226 09/07/19 0446 09/08/19 0550  NA 142 140 141 142  K 3.7 3.6 3.6 3.3*  CL 105 105 106 104  CO2 21* 23 23 27   GLUCOSE 162* 154* 136* 104*  BUN 19 19 18 20   CREATININE 0.68 0.68 0.70 0.68  CALCIUM 10.4* 9.2 9.0 8.7*   GFR: Estimated Creatinine Clearance: 44.8 mL/min (by C-G  formula based on SCr of 0.68 mg/dL). Liver Function Tests: Recent Labs  Lab 09/05/19 1600 09/06/19 0226  AST 17 16  ALT 15 13  ALKPHOS 70 59  BILITOT 0.7 0.6  PROT 8.0 6.5  ALBUMIN 4.4 3.6   Recent Labs  Lab 09/05/19 1600  LIPASE 94*   No results for input(s): AMMONIA in the last 168 hours. Coagulation Profile: No results for input(s): INR, PROTIME in the last 168 hours. Cardiac Enzymes: No results for input(s): CKTOTAL, CKMB, CKMBINDEX, TROPONINI in the last 168 hours. BNP (last 3 results) No results for input(s): PROBNP in the last 8760 hours. HbA1C: Recent Labs    09/06/19 0042  HGBA1C 6.5*   CBG: Recent Labs  Lab 09/07/19 0558 09/07/19 1203 09/07/19 1741 09/07/19 2350 09/08/19 0600  GLUCAP 135* 113* 131* 104* 100*   Lipid Profile: No results for input(s): CHOL, HDL, LDLCALC, TRIG, CHOLHDL, LDLDIRECT in the last 72 hours. Thyroid Function Tests: No results for input(s): TSH, T4TOTAL, FREET4, T3FREE, THYROIDAB in the last 72 hours. Anemia Panel: No results for input(s): VITAMINB12, FOLATE, FERRITIN, TIBC, IRON, RETICCTPCT in the last 72 hours. Sepsis Labs: No results for input(s): PROCALCITON, LATICACIDVEN in the last 168 hours.  Recent Results (from the past 240 hour(s))  SARS Coronavirus 2 by RT PCR (hospital order, performed in [facility] hospital lab) Nasopharyngeal Nasopharyngeal Swab     Status: None   Collection Time: 09/05/19  9:01 PM   Specimen: Nasopharyngeal Swab  Result Value Ref Range Status   SARS Coronavirus 2 NEGATIVE NEGATIVE Final    Comment: (NOTE) SARS-CoV-2 target nucleic acids are NOT DETECTED.  The SARS-CoV-2 RNA is generally detectable in upper and lower respiratory specimens during the acute phase of infection. The lowest concentration of SARS-CoV-2 viral copies this assay can detect is 250 copies / mL. A negative result does not preclude SARS-CoV-2 infection and should not be used as the sole basis for treatment or  other patient management decisions.  A negative result may occur with improper specimen collection / handling, submission of specimen other than nasopharyngeal swab, presence of viral mutation(s) within the areas targeted by this assay, and inadequate number of viral copies (<250 copies / mL). A negative result must be combined with clinical observations, patient history, and epidemiological information.  Fact Sheet for Patients:   BoilerBrush.com.cy  Fact Sheet for Healthcare Providers: https://pope.com/  This test is not yet approved or  cleared by the [place] FDA and has been authorized for detection and/or diagnosis of SARS-CoV-2 by FDA under an Emergency Use Authorization (EUA).  This EUA will remain in effect (meaning this test can be used) for the duration of the COVID-19 declaration under Section 564(b)(1) of the Act, 21 U.S.C. section 360bbb-3(b)(1), unless the authorization is terminated or revoked sooner.  Performed at [facility], [address], [place], [place] [address]       Radiology Studies: DG Abd Portable 1V-Small Bowel Obstruction Protocol-24 hr delay  Result Date: 09/08/2019 CLINICAL DATA:  85 year old female with small bowel obstruction EXAM: PORTABLE ABDOMEN - 1 VIEW COMPARISON:  09/07/2019 FINDINGS: Gas within stomach small bowel and colon. Persistent dilation of small bowel loops in the mid abdomen with a paucity of colonic and rectal gas. Partially imaged gastric tube within the upper abdomen. No unexpected radiopaque foreign body. No unexpected soft tissue density or calcifications. Unremarkable musculoskeletal structures. IMPRESSION: Persisting small bowel obstruction. Gastric tube partially imaged. Electronically Signed   By: Gilmer Mor D.O.   On: 09/08/2019 08:41   DG Abd Portable 1V-Small Bowel Protocol-Position Verification  Result Date: 09/07/2019 CLINICAL DATA:   Nasogastric tube placement. EXAM: PORTABLE ABDOMEN - 1 VIEW COMPARISON:  Radiographs earlier the same date and 09/05/2019. CT 09/05/2019. FINDINGS: 1429 hours. Nasogastric tube tip projects over the mid stomach. There is persistent moderate diffuse small bowel distension with relative decompression of the colon. No supine evidence of free intraperitoneal air. The bones appear unchanged. IMPRESSION: 1. Nasogastric tube tip projects over the mid stomach. 2. Persistent small bowel obstruction. Electronically Signed   By: Carey Bullocks M.D.   On: 09/07/2019 14:58   DG Abd Portable 1V  Result Date: 09/07/2019  CLINICAL DATA:  85 year old female with history of small-bowel obstruction. EXAM: PORTABLE ABDOMEN - 1 VIEW COMPARISON:  09/05/2019. FINDINGS: Multiple dilated loops of small bowel are again noted in the central abdomen measuring up to 5.3 cm in diameter, increased compared to the prior examination. There is a paucity of colonic and rectal gas and stool. No frank pneumoperitoneum noted on this single supine view. IMPRESSION: 1. Findings are compatible with persistent small bowel obstruction, as above. Electronically Signed   By: Trudie Reedaniel  Entrikin M.D.   On: 09/07/2019 07:24   DG Basil Dessaso G Tube Plc W/Fl W/Rad  Result Date: 09/06/2019 CLINICAL DATA:  Small-bowel obstruction. EXAM: NASO G TUBE PLACEMENT WITH FL AND WITH RAD FLUOROSCOPY TIME:  Fluoroscopy Time:  0 Radiation Exposure Index (if provided by the fluoroscopic device): 0 mGy Number of Acquired Spot Images: None COMPARISON:  CT abdomen/pelvis 09/05/2019, abdominal radiograph 09/05/2019. FINDINGS: NG tube placement was attempted. However, the NG tube did not pass beyond the level of the nasal passage despite multiple attempts via the right and left nares. Fluoroscopy was not utilized. IMPRESSION: Unsuccessful NG tube placement. The NG tube did not pass beyond the level of the nasal passage despite multiple attempts via the right and left nares.  Electronically Signed   By: Jackey LogeKyle  Golden DO   On: 09/06/2019 12:33     LOS: 3 days   Lanae Boastamesh Samari Gorby, MD [facility]  09/08/2019, 9:46 AM

## 2019-09-09 ENCOUNTER — Inpatient Hospital Stay (HOSPITAL_COMMUNITY): Payer: Medicare Other

## 2019-09-09 LAB — CBC
HCT: 43 % (ref 36.0–46.0)
Hemoglobin: 13.7 g/dL (ref 12.0–15.0)
MCH: 30.8 pg (ref 26.0–34.0)
MCHC: 31.9 g/dL (ref 30.0–36.0)
MCV: 96.6 fL (ref 80.0–100.0)
Platelets: 119 10*3/uL — ABNORMAL LOW (ref 150–400)
RBC: 4.45 MIL/uL (ref 3.87–5.11)
RDW: 13.4 % (ref 11.5–15.5)
WBC: 5.5 10*3/uL (ref 4.0–10.5)
nRBC: 0 % (ref 0.0–0.2)

## 2019-09-09 LAB — COMPREHENSIVE METABOLIC PANEL
ALT: 15 U/L (ref 0–44)
AST: 34 U/L (ref 15–41)
Albumin: 3.4 g/dL — ABNORMAL LOW (ref 3.5–5.0)
Alkaline Phosphatase: 46 U/L (ref 38–126)
Anion gap: 14 (ref 5–15)
BUN: 26 mg/dL — ABNORMAL HIGH (ref 8–23)
CO2: 27 mmol/L (ref 22–32)
Calcium: 8.8 mg/dL — ABNORMAL LOW (ref 8.9–10.3)
Chloride: 104 mmol/L (ref 98–111)
Creatinine, Ser: 0.82 mg/dL (ref 0.44–1.00)
GFR calc Af Amer: >60 mL/min (ref >60)
GFR calc non Af Amer: >60 mL/min (ref >60)
Glucose, Bld: 99 mg/dL (ref 70–99)
Potassium: 3.5 mmol/L (ref 3.5–5.1)
Sodium: 145 mmol/L (ref 135–145)
Total Bilirubin: 1.1 mg/dL (ref 0.3–1.2)
Total Protein: 6.4 g/dL — ABNORMAL LOW (ref 6.5–8.1)

## 2019-09-09 LAB — GLUCOSE, CAPILLARY
Glucose-Capillary: 128 mg/dL — ABNORMAL HIGH (ref 70–99)
Glucose-Capillary: 136 mg/dL — ABNORMAL HIGH (ref 70–99)
Glucose-Capillary: 156 mg/dL — ABNORMAL HIGH (ref 70–99)
Glucose-Capillary: 98 mg/dL (ref 70–99)
Glucose-Capillary: 99 mg/dL (ref 70–99)

## 2019-09-09 MED ORDER — DEXTROSE-NACL 5-0.9 % IV SOLN: INTRAVENOUS | Status: DC

## 2019-09-09 NOTE — Progress Notes (Signed)
PROGRESS NOTE    Bonnie Briggs  RUE:[id number] DOB: [birth year] DOA: 09/05/2019 PCP: Patient, No Pcp Per   Chef Complaints: nausea vomiting and abd pain x 24 hrs  Brief Narrative: 85 y.o. female with medical history significant of previous small bowel obstruction in 2015 status post exploratory laparotomy, diabetes, hypertension who is visiting her children from [place] brought in today secondary to abdominal pain with nausea and vomiting.  This has been going on for the last 24 hours.  The vomitus is clear nonbloody and nonbilious.  She is also has not had any bowel movement.  No gas passage today.  Denied any fever.  Patient took some Tums.  She ate some chicken of from [employer] and thought that was the cause.  She was seen in the ER and initial work-up now showing small bowel obstruction.  This appears to be similar to what she had back in 2015.  No hematemesis no melena no bright red blood per rectum.  Patient being admitted to the hospital was small bowel obstruction probably secondary to previous surgery.  General surgery consulted to follow along..  ED Course: Temperature 98.4 blood pressure 116/88 pulse 96 respiratory rate of 20 oxygen sat 99% on room air.  CBC largely within normal except for hemoglobin 15.3.  Chemistry largely within normal calcium 10.4 CO2 21.  Glucose 162.  CT abdomen pelvis shows distal small bowel obstruction presumably due to an adhesion with moderate mesenteric edema.  Patient being admitted and NG tube inserted.  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 IV fluids, ambulate, KUB in a.m. and follow-up labs.If no improvement in 48 hours may need  ex lap.  Dehydration: In the setting of SBO, continue IV  fluids. Elevated lipase at 95, repeat levels in the morning, unclear significance.  Secondary to dehydration.  Pancreas was unremarkable on CT abdomen 09/05/19.  Hypokalemia-resolved.  HTN on valsartan, diamox and diltiazem at home: Blood pressure poorly controlled,  likely from pain bowel obstruction.  Continue hydralazine 10 mg as needed, home meds on hold.    DM/HLD on metformin and lipitor at home: P.o. meds on hold.  Blood sugar stable on sliding scale.HbA1c controlled at 6.5 on 09/06/19 Recent Labs  Lab 09/08/19 0600 09/08/19 1219 09/08/19 1836 09/09/19 0009 09/09/19 0644  GLUCAP 100* 108* 96 98 99   Hx of Stroke/memory issues: on asa 325 mg, statins.  Due to nasal bleeding and Bowel obstruction unable to use po meds. Monitor.  DVT prophylaxis: enoxaparin (LOVENOX) injection 40 mg Start: 09/06/19 0100 Code Status:FULL Family Communication: plan of care discussed with patient and her daughter at the bedside.   Status is: Inpatient Remains inpatient appropriate because:IV treatments appropriate due to intensity of illness or inability to take PO and Inpatient level of care appropriate due to severity of illness for ongoing management of small bowel obstruction  Dis[facility]            Anticipated d/c is to: Home            Anticipated d/c date is: > 3 days            Patient currently is not medically stable to d/c.   Nutrition: Diet Order  Diet NPO time specified  Diet effective now                       Body mass index is 21.26 kg/m.  Consultants:see note  Procedures:see note Microbiology:see note  Medications: Scheduled Meds:  enoxaparin (LOVENOX) injection  40 mg Subcutaneous QHS   insulin aspart  0-9 Units Subcutaneous Q6H   oxymetazoline  1 spray Each Nare BID   sodium chloride flush  10-40 mL Intracatheter Q12H   sodium chloride flush  3 mL Intravenous Once   Continuous Infusions:  lactated ringers 100  mL/hr at 09/09/19 0835    Antimicrobials: Anti-infectives (From admission, onward)   None       Objective: Vitals: Today's Vitals   09/08/19 2051 09/08/19 2133 09/08/19 2139 09/09/19 0559  BP:  (!) 145/99  (!) 164/88  Pulse:  100 (!) 102 96  Resp:  14  14  Temp:   98.1 F (36.7 C) (!) 97.5 F (36.4 C)  TempSrc:   Oral Oral  SpO2:  94% 95% 94%  Weight:      Height:      PainSc: 8        Intake/Output Summary (Last 24 hours) at 09/09/2019 0838 Last data filed at 09/09/2019 0445 Gross per 24 hour  Intake 1104.74 ml  Output 3450 ml  Net -2345.26 ml   Filed Weights   09/05/19 1519  Weight: 54.4 kg   Weight change:    Intake/Output from previous day: 07/17 0701 - 07/18 0700 In: 1104.7 [I.V.:825.4; IV Piggyback:279.4] Out: 3450 [Urine:300; Emesis/NG output:3150] Intake/Output this shift: No intake/output data recorded.  Examination:  General exam: AAO , NAD, weak appearing. HEENT:Oral mucosa moist, Ear/Nose WNL grossly, dentition normal. Respiratory system: bilaterally clear,no wheezing or crackles,no use of accessory muscle Cardiovascular system: S1 & S2 +, No JVD,. Gastrointestinal system:Abdomen soft,distended,mildly tender,NG tube present, bowel sounds sluggish.   Nervous System:Alert, awake, moving extremities and grossly non-focal. Extremities: No edema, distal peripheral pulses palpable.  Skin: No rashes,no icterus. MSK: Normal muscle bulk,tone,power.  Data Reviewed: I have personally reviewed following labs and imaging studies CBC: Recent Labs  Lab 09/05/19 1600 09/06/19 0226 09/07/19 0446 09/08/19 0550 09/09/19 0611  WBC 6.4 6.6 4.3 4.8 5.5  HGB 15.3* 13.5 13.4 13.0 13.7  HCT 47.3* 42.8 41.3 40.4 43.0  MCV 95.4 95.3 94.5 94.4 96.6  PLT 158 143* 130* 119* 119*   Basic Metabolic Panel: Recent Labs  Lab 09/05/19 1600 09/06/19 0226 09/07/19 0446 09/08/19 0550 09/09/19 0611  NA 142 140 141 142 145  K 3.7 3.6 3.6 3.3* 3.5  CL 105 105 106  104 104  CO2 21* 23 23 27 27   GLUCOSE 162* 154* 136* 104* 99  BUN 19 19 18 20  26*  CREATININE 0.68 0.68 0.70 0.68 0.82  CALCIUM 10.4* 9.2 9.0 8.7* 8.8*   GFR: Estimated Creatinine Clearance: 43.8 mL/min (by C-G formula based on SCr of 0.82 mg/dL). Liver Function Tests: Recent Labs  Lab 09/05/19 1600 09/06/19 0226 09/09/19 0611  AST 17 16 34  ALT 15 13 15   ALKPHOS 70 59 46  BILITOT 0.7 0.6 1.1  PROT 8.0 6.5 6.4*  ALBUMIN 4.4 3.6 3.4*   Recent Labs  Lab 09/05/19 1600  LIPASE 94*   No results for input(s): AMMONIA in the last 168 hours. Coagulation Profile: No results for input(s): INR, PROTIME in the last 168 hours. Cardiac Enzymes: No results for input(s): CKTOTAL, CKMB, CKMBINDEX, TROPONINI in the  last 168 hours. BNP (last 3 results) No results for input(s): PROBNP in the last 8760 hours. HbA1C: No results for input(s): HGBA1C in the last 72 hours. CBG: Recent Labs  Lab 09/08/19 0600 09/08/19 1219 09/08/19 1836 09/09/19 0009 09/09/19 0644  GLUCAP 100* 108* 96 98 99   Lipid Profile: No results for input(s): CHOL, HDL, LDLCALC, TRIG, CHOLHDL, LDLDIRECT in the last 72 hours. Thyroid Function Tests: No results for input(s): TSH, T4TOTAL, FREET4, T3FREE, THYROIDAB in the last 72 hours. Anemia Panel: No results for input(s): VITAMINB12, FOLATE, FERRITIN, TIBC, IRON, RETICCTPCT in the last 72 hours. Sepsis Labs: No results for input(s): PROCALCITON, LATICACIDVEN in the last 168 hours.  Recent Results (from the past 240 hour(s))  SARS Coronavirus 2 by RT PCR (hospital order, performed in [facility] hospital lab) Nasopharyngeal Nasopharyngeal Swab     Status: None   Collection Time: 09/05/19  9:01 PM   Specimen: Nasopharyngeal Swab  Result Value Ref Range Status   SARS Coronavirus 2 NEGATIVE NEGATIVE Final    Comment: (NOTE) SARS-CoV-2 target nucleic acids are NOT DETECTED.  The SARS-CoV-2 RNA is generally detectable in upper and lower respiratory specimens  during the acute phase of infection. The lowest concentration of SARS-CoV-2 viral copies this assay can detect is 250 copies / mL. A negative result does not preclude SARS-CoV-2 infection and should not be used as the sole basis for treatment or other patient management decisions.  A negative result may occur with improper specimen collection / handling, submission of specimen other than nasopharyngeal swab, presence of viral mutation(s) within the areas targeted by this assay, and inadequate number of viral copies (<250 copies / mL). A negative result must be combined with clinical observations, patient history, and epidemiological information.  Fact Sheet for Patients:   BoilerBrush.com.cy  Fact Sheet for Healthcare Providers: https://pope.com/  This test is not yet approved or  cleared by the [place] FDA and has been authorized for detection and/or diagnosis of SARS-CoV-2 by FDA under an Emergency Use Authorization (EUA).  This EUA will remain in effect (meaning this test can be used) for the duration of the COVID-19 declaration under Section 564(b)(1) of the Act, 21 U.S.C. section 360bbb-3(b)(1), unless the authorization is terminated or revoked sooner.  Performed at [facility], [address] [place], [place] [address]       Radiology Studies: DG Abd 1 View  Result Date: 09/08/2019 CLINICAL DATA:  8 hour delay for small bowel obstruction. EXAM: ABDOMEN - 1 VIEW COMPARISON:  Radiograph 09/08/2019, CT 09/05/2019 or you obstructive FINDINGS: Few punctate radiodensities throughout the abdomen may reflect diluted/diffused contrast media with clustered loops of air distended small bowel in the mid abdomen appearing similar to prior and compatible with persisting small-bowel obstruction. No convincing evidence of free intraperitoneal air though evaluation limited on portable supine radiography. No other acute  soft tissue abnormality. No acute or suspicious osseous abnormality. Stable degenerative changes in the spine hips and pelvis. IMPRESSION: 1. Findings compatible with persisting small bowel obstruction. 2. Largely diffused contrast media. If further follow-up imaging is to be obtained, consider administering additional contrast media. Electronically Signed   By: Kreg Shropshire M.D.   On: 09/08/2019 16:38   DG Abd Portable 1V-Small Bowel Obstruction Protocol-24 hr delay  Result Date: 09/08/2019 CLINICAL DATA:  85 year old female with small bowel obstruction EXAM: PORTABLE ABDOMEN - 1 VIEW COMPARISON:  09/07/2019 FINDINGS: Gas within stomach small bowel and colon. Persistent dilation of small bowel loops in  the mid abdomen with a paucity of colonic and rectal gas. Partially imaged gastric tube within the upper abdomen. No unexpected radiopaque foreign body. No unexpected soft tissue density or calcifications. Unremarkable musculoskeletal structures. IMPRESSION: Persisting small bowel obstruction. Gastric tube partially imaged. Electronically Signed   By: Gilmer Mor D.O.   On: 09/08/2019 08:41   DG Abd Portable 1V-Small Bowel Protocol-Position Verification  Result Date: 09/07/2019 CLINICAL DATA:  Nasogastric tube placement. EXAM: PORTABLE ABDOMEN - 1 VIEW COMPARISON:  Radiographs earlier the same date and 09/05/2019. CT 09/05/2019. FINDINGS: 1429 hours. Nasogastric tube tip projects over the mid stomach. There is persistent moderate diffuse small bowel distension with relative decompression of the colon. No supine evidence of free intraperitoneal air. The bones appear unchanged. IMPRESSION: 1. Nasogastric tube tip projects over the mid stomach. 2. Persistent small bowel obstruction. Electronically Signed   By: Carey Bullocks M.D.   On: 09/07/2019 14:58     LOS: 4 days   Lanae Boast, MD [facility]  09/09/2019, 8:38 AM

## 2019-09-09 NOTE — Progress Notes (Signed)
2 Days Post-Op   Subjective/Chief Complaint: Some flatus  Feels like she can have BM Films stable today  No abdominal pain    Objective: Vital signs in last 24 hours: Temp:  [97.5 F (36.4 C)-98.6 F (37 C)] 97.5 F (36.4 C) (07/18 0559) Pulse Rate:  [69-102] 96 (07/18 0559) Resp:  [14-16] 14 (07/18 0559) BP: (91-164)/(53-99) 164/88 (07/18 0559) SpO2:  [94 %-99 %] 94 % (07/18 0559) Last BM Date: 09/05/19  Intake/Output from previous day: 07/17 0701 - 07/18 0700 In: 1104.7 [I.V.:825.4; IV Piggyback:279.4] Out: 3450 [Urine:300; Emesis/NG output:3150] Intake/Output this shift: No intake/output data recorded.  General appearance: alert and cooperative Resp: clear to auscultation bilaterally Cardio: regular rate and rhythm, S1, S2 normal, no murmur, click, rub or gallop GI: non distended soft no rebound or guarding NGT bilious 3 L   Lab Results:  Recent Labs    09/08/19 0550 09/09/19 0611  WBC 4.8 5.5  HGB 13.0 13.7  HCT 40.4 43.0  PLT 119* 119*   BMET Recent Labs    09/08/19 0550 09/09/19 0611  NA 142 145  K 3.3* 3.5  CL 104 104  CO2 27 27  GLUCOSE 104* 99  BUN 20 26*  CREATININE 0.68 0.82  CALCIUM 8.7* 8.8*   PT/INR No results for input(s): LABPROT, INR in the last 72 hours. ABG No results for input(s): PHART, HCO3 in the last 72 hours.  Invalid input(s): PCO2, PO2  Studies/Results: DG Abd 1 View  Result Date: 09/08/2019 CLINICAL DATA:  8 hour delay for small bowel obstruction. EXAM: ABDOMEN - 1 VIEW COMPARISON:  Radiograph 09/08/2019, CT 09/05/2019 or you obstructive FINDINGS: Few punctate radiodensities throughout the abdomen may reflect diluted/diffused contrast media with clustered loops of air distended small bowel in the mid abdomen appearing similar to prior and compatible with persisting small-bowel obstruction. No convincing evidence of free intraperitoneal air though evaluation limited on portable supine radiography. No other acute soft tissue  abnormality. No acute or suspicious osseous abnormality. Stable degenerative changes in the spine hips and pelvis. IMPRESSION: 1. Findings compatible with persisting small bowel obstruction. 2. Largely diffused contrast media. If further follow-up imaging is to be obtained, consider administering additional contrast media. Electronically Signed   By: Kreg Shropshire M.D.   On: 09/08/2019 16:38   DG Abd Portable 1V-Small Bowel Obstruction Protocol-24 hr delay  Result Date: 09/08/2019 CLINICAL DATA:  85 year old female with small bowel obstruction EXAM: PORTABLE ABDOMEN - 1 VIEW COMPARISON:  09/07/2019 FINDINGS: Gas within stomach small bowel and colon. Persistent dilation of small bowel loops in the mid abdomen with a paucity of colonic and rectal gas. Partially imaged gastric tube within the upper abdomen. No unexpected radiopaque foreign body. No unexpected soft tissue density or calcifications. Unremarkable musculoskeletal structures. IMPRESSION: Persisting small bowel obstruction. Gastric tube partially imaged. Electronically Signed   By: Gilmer Mor D.O.   On: 09/08/2019 08:41   DG Abd Portable 1V-Small Bowel Protocol-Position Verification  Result Date: 09/07/2019 CLINICAL DATA:  Nasogastric tube placement. EXAM: PORTABLE ABDOMEN - 1 VIEW COMPARISON:  Radiographs earlier the same date and 09/05/2019. CT 09/05/2019. FINDINGS: 1429 hours. Nasogastric tube tip projects over the mid stomach. There is persistent moderate diffuse small bowel distension with relative decompression of the colon. No supine evidence of free intraperitoneal air. The bones appear unchanged. IMPRESSION: 1. Nasogastric tube tip projects over the mid stomach. 2. Persistent small bowel obstruction. Electronically Signed   By: Carey Bullocks M.D.   On: 09/07/2019 14:58  Anti-infectives: Anti-infectives (From admission, onward)   None      Assessment/Plan: SBO- NGT IN PLACE  CLINICALLY BETTER BUT FILMS STABLE  PASSING FLATUS  SO CLINICALLY BETTER  Cont NGT and NPO except for ice chips  Ambulate  KUB in AM  FOLLOW LABS  OK TO AMBULATE  If no better in next 48 hours, may need ex lap Discussed with daughter    LOS: 4 days    Clovis Pu Keiondre Colee 09/09/2019

## 2019-09-10 ENCOUNTER — Inpatient Hospital Stay (HOSPITAL_COMMUNITY): Payer: Medicare Other

## 2019-09-10 LAB — CBC
HCT: 42.7 % (ref 36.0–46.0)
Hemoglobin: 13.3 g/dL (ref 12.0–15.0)
MCH: 30.3 pg (ref 26.0–34.0)
MCHC: 31.1 g/dL (ref 30.0–36.0)
MCV: 97.3 fL (ref 80.0–100.0)
Platelets: 110 10*3/uL — ABNORMAL LOW (ref 150–400)
RBC: 4.39 MIL/uL (ref 3.87–5.11)
RDW: 13.2 % (ref 11.5–15.5)
WBC: 4.9 10*3/uL (ref 4.0–10.5)
nRBC: 0 % (ref 0.0–0.2)

## 2019-09-10 LAB — COMPREHENSIVE METABOLIC PANEL
ALT: 14 U/L (ref 0–44)
AST: 27 U/L (ref 15–41)
Albumin: 3.1 g/dL — ABNORMAL LOW (ref 3.5–5.0)
Alkaline Phosphatase: 41 U/L (ref 38–126)
Anion gap: 9 (ref 5–15)
BUN: 17 mg/dL (ref 8–23)
CO2: 29 mmol/L (ref 22–32)
Calcium: 8.2 mg/dL — ABNORMAL LOW (ref 8.9–10.3)
Chloride: 108 mmol/L (ref 98–111)
Creatinine, Ser: 0.55 mg/dL (ref 0.44–1.00)
GFR calc Af Amer: >60 mL/min (ref >60)
GFR calc non Af Amer: >60 mL/min (ref >60)
Glucose, Bld: 172 mg/dL — ABNORMAL HIGH (ref 70–99)
Potassium: 2.9 mmol/L — ABNORMAL LOW (ref 3.5–5.1)
Sodium: 146 mmol/L — ABNORMAL HIGH (ref 135–145)
Total Bilirubin: 0.7 mg/dL (ref 0.3–1.2)
Total Protein: 5.9 g/dL — ABNORMAL LOW (ref 6.5–8.1)

## 2019-09-10 LAB — LIPASE, BLOOD: Lipase: 38 U/L (ref 11–51)

## 2019-09-10 LAB — GLUCOSE, CAPILLARY
Glucose-Capillary: 100 mg/dL — ABNORMAL HIGH (ref 70–99)
Glucose-Capillary: 110 mg/dL — ABNORMAL HIGH (ref 70–99)
Glucose-Capillary: 117 mg/dL — ABNORMAL HIGH (ref 70–99)
Glucose-Capillary: 169 mg/dL — ABNORMAL HIGH (ref 70–99)

## 2019-09-10 MED ORDER — POTASSIUM CHLORIDE IN NACL 40-0.9 MEQ/L-% IV SOLN
INTRAVENOUS | Status: DC
  Filled 2019-09-10 (×4): qty 1000

## 2019-09-10 MED ORDER — POTASSIUM CHLORIDE 10 MEQ/100ML IV SOLN
10.0000 meq | INTRAVENOUS | Status: AC
  Administered 2019-09-10 (×6): 10 meq via INTRAVENOUS
  Filled 2019-09-10: qty 100

## 2019-09-10 NOTE — Progress Notes (Signed)
PROGRESS NOTE    Bonnie Briggs  FWY:[id number] DOB: April 06, 1934 DOA: 09/05/2019 PCP: Patient, No Pcp Per   Chef Complaints: nausea vomiting and abd pain x 24 hrs  Brief Narrative: 85 y.o. female with medical history significant of previous small bowel obstruction in 2015 status post exploratory laparotomy, diabetes, hypertension who is visiting her children from [place] brought in today secondary to abdominal pain with nausea and vomiting.  This has been going on for the last 24 hours.  The vomitus is clear nonbloody and nonbilious.  She is also has not had any bowel movement.  No gas passage today.  Denied any fever.  Patient took some Tums.  She ate some chicken of from [employer] and thought that was the cause.  She was seen in the ER and initial work-up now showing small bowel obstruction.  This appears to be similar to what she had back in 2015.  No hematemesis no melena no bright red blood per rectum.  Patient being admitted to the hospital was small bowel obstruction probably secondary to previous surgery.  General surgery consulted to follow along..  ED Course: Temperature 98.4 blood pressure 116/88 pulse 96 respiratory rate of 20 oxygen sat 99% on room air.  CBC largely within normal except for hemoglobin 15.3.  Chemistry largely within normal calcium 10.4 CO2 21.  Glucose 162. CT abdomen pelvis shows distal small bowel obstruction presumably due to an adhesion with moderate mesenteric edema.   Pt was admitted.  Unsuccessful NG tube insertion and also had bleeding. 09/07/19: S/p NG tube insertion under anesthesia by surgery. Followed by surgery,remains on NG tube decompression for ongoing obstruction  Subjective: Reports she had a flatus with loud "sound" Still having abdominal pain NGT+ and feels uncomfortable on it. 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 No BM yet.Cont on n.p.o. IV fluids electrolyte replacement.  Surgery following, ordered KUB in the morning, continue NG tube decompression. Patient is very reluctant to go through surgery and also compalins about NG Tube.  Hypokalemia-we will replete with IV potassium chloride this morning and also add potassium chloride and IV fluids   HTN on valsartan, diamox and diltiazem at home: BP 140s to 180s, borderline control p.o. meds on hold due to #1.  Continue as needed iv hydralazine.  Dehydration: In the setting of SBO, continue IV fluids with potassium chloride.  NGT bleeding-continue Afrin.  Stable.  Elevated lipase on admission, unclear etiology, resolved on recheck.Pancreas was unremarkable on CT abdomen 09/05/19.  DM/HLD on metformin and lipitor at home: P.o. meds on hold.  Blood sugar stable. On IVF. Cont to monitor. .HbA1c controlled at 6.5 on 09/06/19. Recent Labs  Lab 09/09/19 1251 09/09/19 1838 09/09/19 2347 09/10/19 0547 09/10/19 1153  GLUCAP 128* 136* 156* 169* 117*   Hx of Stroke/memory issues: on asa 325 mg, statins.  Due to nasal bleeding and Bowel obstruction unable to use po meds. Monitor.  DVT prophylaxis: enoxaparin (LOVENOX) injection 40 mg Start: 09/06/19 0100 Code Status:FULL Family Communication: plan of care discussed with patient, daughter not at bedside this am. Will update today  Status is: Inpatient Remains inpatient appropriate because:IV treatments appropriate due to intensity of illness or inability to take PO and Inpatient level of care appropriate due to severity of illness for ongoing management of small bowel obstruction  Dispo:The patient is from:  Home from [facility]            Anticipated d/c is to: Home            Anticipated d/c date is: > 3 days            Patient currently is not medically stable to d/c.  Nutrition: Diet Order            Diet NPO time specified  Diet effective now                   Body mass index is 21.26 kg/m.  Consultants:see note  Procedures:see note Microbiology:see note  Medications: Scheduled Meds: . enoxaparin (LOVENOX) injection  40 mg Subcutaneous QHS  . insulin aspart  0-9 Units Subcutaneous Q6H  . oxymetazoline  1 spray Each Nare BID  . sodium chloride flush  10-40 mL Intracatheter Q12H  . sodium chloride flush  3 mL Intravenous Once   Continuous Infusions: . 0.9 % NaCl with KCl 40 mEq / L 100 mL/hr at 09/10/19 0921  . potassium chloride 10 mEq (09/10/19 1217)    Antimicrobials: Anti-infectives (From admission, onward)   None      Objective: Vitals: Today's Vitals   09/09/19 2121 09/10/19 0543 09/10/19 0606 09/10/19 0800  BP: (!) 156/84 (!) 184/100 (!) 169/99   Pulse: 92 95    Resp: 14 14    Temp: 98.4 F (36.9 C) 98.2 F (36.8 C)    TempSrc: Oral Oral    SpO2: 93% 93%    Weight:      Height:      PainSc:   6  Asleep    Intake/Output Summary (Last 24 hours) at 09/10/2019 1322 Last data filed at 09/10/2019 1018 Gross per 24 hour  Intake 2856.36 ml  Output 450 ml  Net 2406.36 ml   Filed Weights   09/05/19 1519  Weight: 54.4 kg   Weight change:    Intake/Output from previous day: 07/18 0701 - 07/19 0700 In: 2262.4 [I.V.:2262.4] Out: 450 [Urine:100; Emesis/NG output:350] Intake/Output this shift: Total I/O In: 619.9 [I.V.:519.9; IV Piggyback:99.9] Out: 0   Examination:  General exam: AAOx3, NGT+, NAD, weak appearing. HEENT:Oral mucosa moist, Ear/Nose WNL grossly, dentition normal. Respiratory system: bilaterally clear,no wheezing or crackles,no use of accessory muscle Cardiovascular system:S1 & S2 +,No JVD,. Gastrointestinal system:Abdomen soft, distended, mildly tender, BS sluggish Nervous System:Alert, awake, moving extremities and grossly nonfocal Extremities: No edema, distal peripheral pulses palpable.  Skin: No rashes,no icterus. MSK: Normal muscle bulk,tone, power  Data Reviewed: I have  personally reviewed following labs and imaging studies CBC: Recent Labs  Lab 09/06/19 0226 09/07/19 0446 09/08/19 0550 09/09/19 0611 09/10/19 0548  WBC 6.6 4.3 4.8 5.5 4.9  HGB 13.5 13.4 13.0 13.7 13.3  HCT 42.8 41.3 40.4 43.0 42.7  MCV 95.3 94.5 94.4 96.6 97.3  PLT 143* 130* 119* 119* 110*   Basic Metabolic Panel: Recent Labs  Lab 09/06/19 0226 09/07/19 0446 09/08/19 0550 09/09/19 0611 09/10/19 0548  NA 140 141 142 145 146*  K 3.6 3.6 3.3* 3.5 2.9*  CL 105 106 104 104 108  CO2 23 23 27 27 29   GLUCOSE 154* 136* 104* 99 172*  BUN 19 18 20  26* 17  CREATININE 0.68 0.70 0.68 0.82 0.55  CALCIUM 9.2 9.0 8.7* 8.8* 8.2*   GFR: Estimated Creatinine Clearance: 44.8 mL/min (by C-G formula based on SCr of 0.55 mg/dL). Liver Function Tests: Recent Labs  Lab 09/05/19 1600 09/06/19 0226 09/09/19  [id number] 09/10/19 0548  AST 17 16 34 27  ALT 15 13 15 14   ALKPHOS 70 59 46 41  BILITOT 0.7 0.6 1.1 0.7  PROT 8.0 6.5 6.4* 5.9*  ALBUMIN 4.4 3.6 3.4* 3.1*   Recent Labs  Lab 09/05/19 1600 09/10/19 0548  LIPASE 94* 38   No results for input(s): AMMONIA in the last 168 hours. Coagulation Profile: No results for input(s): INR, PROTIME in the last 168 hours. Cardiac Enzymes: No results for input(s): CKTOTAL, CKMB, CKMBINDEX, TROPONINI in the last 168 hours. BNP (last 3 results) No results for input(s): PROBNP in the last 8760 hours. HbA1C: No results for input(s): HGBA1C in the last 72 hours. CBG: Recent Labs  Lab 09/09/19 1251 09/09/19 1838 09/09/19 2347 09/10/19 0547 09/10/19 1153  GLUCAP 128* 136* 156* 169* 117*   Lipid Profile: No results for input(s): CHOL, HDL, LDLCALC, TRIG, CHOLHDL, LDLDIRECT in the last 72 hours. Thyroid Function Tests: No results for input(s): TSH, T4TOTAL, FREET4, T3FREE, THYROIDAB in the last 72 hours. Anemia Panel: No results for input(s): VITAMINB12, FOLATE, FERRITIN, TIBC, IRON, RETICCTPCT in the last 72 hours. Sepsis Labs: No results for  input(s): PROCALCITON, LATICACIDVEN in the last 168 hours.  Recent Results (from the past 240 hour(s))  SARS Coronavirus 2 by RT PCR (hospital order, [facility]) Nasopharyngeal Nasopharyngeal Swab     Status: None   Collection Time: 09/05/19  9:01 PM   Specimen: Nasopharyngeal Swab  Result Value Ref Range Status   SARS Coronavirus 2 NEGATIVE NEGATIVE Final    Comment: (NOTE) SARS-CoV-2 target nucleic acids are NOT DETECTED.  The SARS-CoV-2 RNA is generally detectable in upper and lower respiratory specimens during the acute phase of infection. The lowest concentration of SARS-CoV-2 viral copies this assay can detect is 250 copies / mL. A negative result does not preclude SARS-CoV-2 infection and should not be used as the sole basis for treatment or other patient management decisions.  A negative result may occur with improper specimen collection / handling, submission of specimen other than nasopharyngeal swab, presence of viral mutation(s) within the areas targeted by this assay, and inadequate number of viral copies (<250 copies / mL). A negative result must be combined with clinical observations, patient history, and epidemiological information.  Fact Sheet for Patients:   BoilerBrush.com.cyhttps://www.fda.gov/media/136312/download  Fact Sheet for Healthcare Providers: https://pope.com/https://www.fda.gov/media/136313/download  This test is not yet approved or  cleared by the [place] FDA and has been authorized for detection and/or diagnosis of SARS-CoV-2 by FDA under an Emergency Use Authorization (EUA).  This EUA will remain in effect (meaning this test can be used) for the duration of the COVID-19 declaration under Section 564(b)(1) of the Act, 21 U.S.C. section 360bbb-3(b)(1), unless the authorization is terminated or revoked sooner.  Performed at [facility], [address] [place], [place] [address]       Radiology Studies: DG Abd 1  View  Result Date: 09/10/2019 CLINICAL DATA:  Small-bowel obstruction. EXAM: ABDOMEN - 1 VIEW COMPARISON:  09/09/2019 FINDINGS: The NG tube tip is in the left upper quadrant. Persistent moderately dilated small bowel loops throughout the abdomen with decompression of the colon. No obvious free air. IMPRESSION: Persistent small-bowel obstruction bowel gas pattern. Electronically Signed   By: Rudie MeyerP.  Gallerani M.D.   On: 09/10/2019 05:35   DG Abd 1 View  Result Date: 09/09/2019 CLINICAL DATA:  Small bowel obstruction. EXAM: ABDOMEN - 1 VIEW COMPARISON:  September 08, 2019 FINDINGS: Similar in  degree high-grade small bowel obstruction with bowel loops dilated to 5 cm. Enteric catheter tip overlies the gastric bubble, however the side hole is at the level of the expected location of the GE junction. IMPRESSION: 1. Similar in degree high-grade small bowel obstruction. 2. Enteric catheter tip overlies the gastric bubble, however the side hole is at the level of the GE junction. Electronically Signed   By: Ted Mcalpine M.D.   On: 09/09/2019 12:58   DG Abd 1 View  Result Date: 09/08/2019 CLINICAL DATA:  8 hour delay for small bowel obstruction. EXAM: ABDOMEN - 1 VIEW COMPARISON:  Radiograph 09/08/2019, CT 09/05/2019 or you obstructive FINDINGS: Few punctate radiodensities throughout the abdomen may reflect diluted/diffused contrast media with clustered loops of air distended small bowel in the mid abdomen appearing similar to prior and compatible with persisting small-bowel obstruction. No convincing evidence of free intraperitoneal air though evaluation limited on portable supine radiography. No other acute soft tissue abnormality. No acute or suspicious osseous abnormality. Stable degenerative changes in the spine hips and pelvis. IMPRESSION: 1. Findings compatible with persisting small bowel obstruction. 2. Largely diffused contrast media. If further follow-up imaging is to be obtained, consider administering  additional contrast media. Electronically Signed   By: Kreg Shropshire M.D.   On: 09/08/2019 16:38     LOS: 5 days   Lanae Boast, MD [facility]  09/10/2019, 1:22 PM

## 2019-09-10 NOTE — Progress Notes (Signed)
[facility] Office:  [phone] General Surgery Progress Note   LOS: 5 days  POD -  3 Days Post-Op  Assessment and Plan: 1.  SBO  Trouble with NGT insertion - Kinsinger - 09/07/2019  Prior open enterolysis - 08/08/2013 - Daphine Deutscher  No progress with KUB - passed small flatus  Continue NGT, repeat KUB and labs  2.  HTN 3.  DM 4.  Hypokalemia  Getting K+ in IVF 5.  DVT prophylaxis - Lovenox   Principal Problem:   SBO (small bowel obstruction) (HCC) Active Problems:   HTN (hypertension)   S/P exploratory laparotomyJune2015   DM (diabetes mellitus) (HCC)  Subjective:  NGT and suture is bothering her.  Minimal flatus.  Daughter, June Bethany, in the room with the patient.  She has a son who lives in [place].  Objective:   Vitals:   09/10/19 0543 09/10/19 0606  BP: (!) 184/100 (!) 169/99  Pulse: 95   Resp: 14   Temp: 98.2 F (36.8 C)   SpO2: 93%      Intake/Output from previous day:  07/18 0701 - 07/19 0700 In: 2262.4 [I.V.:2262.4] Out: 450 [Urine:100; Emesis/NG output:350]  Intake/Output this shift:  Total I/O In: 619.9 [I.V.:519.9; IV Piggyback:99.9] Out: 0    Physical Exam:   General: WN older AA F who is alert and oriented.    HEENT: Normal. Pupils equal. .   Lungs: Clear.   Abdomen: Soft with rare high pitched bowel sounds. NGT - 350 recorded last 24 hours   Lab Results:    Recent Labs    09/09/19 0611 09/10/19 0548  WBC 5.5 4.9  HGB 13.7 13.3  HCT 43.0 42.7  PLT 119* 110*    BMET   Recent Labs    09/09/19 0611 09/10/19 0548  NA 145 146*  K 3.5 2.9*  CL 104 108  CO2 27 29  GLUCOSE 99 172*  BUN 26* 17  CREATININE 0.82 0.55  CALCIUM 8.8* 8.2*    PT/INR  No results for input(s): LABPROT, INR in the last 72 hours.  ABG  No results for input(s): PHART, HCO3 in the last 72 hours.  Invalid input(s): PCO2, PO2   Studies/Results:  DG Abd 1 View  Result Date: 09/10/2019 CLINICAL DATA:  Small-bowel obstruction. EXAM:  ABDOMEN - 1 VIEW COMPARISON:  09/09/2019 FINDINGS: The NG tube tip is in the left upper quadrant. Persistent moderately dilated small bowel loops throughout the abdomen with decompression of the colon. No obvious free air. IMPRESSION: Persistent small-bowel obstruction bowel gas pattern. Electronically Signed   By: Rudie Meyer M.D.   On: 09/10/2019 05:35   DG Abd 1 View  Result Date: 09/09/2019 CLINICAL DATA:  Small bowel obstruction. EXAM: ABDOMEN - 1 VIEW COMPARISON:  September 08, 2019 FINDINGS: Similar in degree high-grade small bowel obstruction with bowel loops dilated to 5 cm. Enteric catheter tip overlies the gastric bubble, however the side hole is at the level of the expected location of the GE junction. IMPRESSION: 1. Similar in degree high-grade small bowel obstruction. 2. Enteric catheter tip overlies the gastric bubble, however the side hole is at the level of the GE junction. Electronically Signed   By: Ted Mcalpine M.D.   On: 09/09/2019 12:58   DG Abd 1 View  Result Date: 09/08/2019 CLINICAL DATA:  8 hour delay for small bowel obstruction. EXAM: ABDOMEN - 1 VIEW COMPARISON:  Radiograph 09/08/2019, CT 09/05/2019 or you obstructive FINDINGS: Few punctate radiodensities throughout the abdomen may  reflect diluted/diffused contrast media with clustered loops of air distended small bowel in the mid abdomen appearing similar to prior and compatible with persisting small-bowel obstruction. No convincing evidence of free intraperitoneal air though evaluation limited on portable supine radiography. No other acute soft tissue abnormality. No acute or suspicious osseous abnormality. Stable degenerative changes in the spine hips and pelvis. IMPRESSION: 1. Findings compatible with persisting small bowel obstruction. 2. Largely diffused contrast media. If further follow-up imaging is to be obtained, consider administering additional contrast media. Electronically Signed   By: Kreg Shropshire M.D.   On:  09/08/2019 16:38     Anti-infectives:   Anti-infectives (From admission, onward)   None      Ovidio Kin, MD, [facility] Surgery Office: [phone] 09/10/2019

## 2019-09-11 ENCOUNTER — Encounter (HOSPITAL_COMMUNITY)
Admission: EM | Disposition: A | Discharge status: Home / Self Care | Payer: Self-pay | Source: Home / Self Care | Attending: Internal Medicine

## 2019-09-11 ENCOUNTER — Inpatient Hospital Stay (HOSPITAL_COMMUNITY): Payer: Medicare Other | Admitting: Anesthesiology

## 2019-09-11 ENCOUNTER — Inpatient Hospital Stay (HOSPITAL_COMMUNITY): Payer: Medicare Other

## 2019-09-11 ENCOUNTER — Encounter (HOSPITAL_COMMUNITY): Payer: Self-pay | Admitting: Internal Medicine

## 2019-09-11 HISTORY — PX: LAPAROSCOPY: SHX197

## 2019-09-11 LAB — CBC
HCT: 46.9 % — ABNORMAL HIGH (ref 36.0–46.0)
Hemoglobin: 14.4 g/dL (ref 12.0–15.0)
MCH: 30.4 pg (ref 26.0–34.0)
MCHC: 30.7 g/dL (ref 30.0–36.0)
MCV: 98.9 fL (ref 80.0–100.0)
Platelets: 118 10*3/uL — ABNORMAL LOW (ref 150–400)
RBC: 4.74 MIL/uL (ref 3.87–5.11)
RDW: 13.2 % (ref 11.5–15.5)
WBC: 6.3 10*3/uL (ref 4.0–10.5)
nRBC: 0 % (ref 0.0–0.2)

## 2019-09-11 LAB — GLUCOSE, CAPILLARY
Glucose-Capillary: 100 mg/dL — ABNORMAL HIGH (ref 70–99)
Glucose-Capillary: 105 mg/dL — ABNORMAL HIGH (ref 70–99)
Glucose-Capillary: 107 mg/dL — ABNORMAL HIGH (ref 70–99)
Glucose-Capillary: 95 mg/dL (ref 70–99)
Glucose-Capillary: 98 mg/dL (ref 70–99)

## 2019-09-11 LAB — BASIC METABOLIC PANEL
Anion gap: 9 (ref 5–15)
BUN: 10 mg/dL (ref 8–23)
CO2: 28 mmol/L (ref 22–32)
Calcium: 8.5 mg/dL — ABNORMAL LOW (ref 8.9–10.3)
Chloride: 109 mmol/L (ref 98–111)
Creatinine, Ser: 0.48 mg/dL (ref 0.44–1.00)
GFR calc Af Amer: >60 mL/min (ref >60)
GFR calc non Af Amer: >60 mL/min (ref >60)
Glucose, Bld: 106 mg/dL — ABNORMAL HIGH (ref 70–99)
Potassium: 4.4 mmol/L (ref 3.5–5.1)
Sodium: 146 mmol/L — ABNORMAL HIGH (ref 135–145)

## 2019-09-11 LAB — SURGICAL PCR SCREEN
MRSA, PCR: NEGATIVE
Staphylococcus aureus: NEGATIVE

## 2019-09-11 SURGERY — LAPAROSCOPY, DIAGNOSTIC
Anesthesia: General | Site: Abdomen

## 2019-09-11 MED ORDER — FENTANYL CITRATE (PF) 100 MCG/2ML IJ SOLN
INTRAMUSCULAR | Status: DC | PRN
  Administered 2019-09-11: 50 ug via INTRAVENOUS
  Administered 2019-09-11 (×2): 25 ug via INTRAVENOUS

## 2019-09-11 MED ORDER — FENTANYL CITRATE (PF) 100 MCG/2ML IJ SOLN
INTRAMUSCULAR | Status: AC
  Filled 2019-09-11: qty 2

## 2019-09-11 MED ORDER — SODIUM CHLORIDE 0.9 % IV SOLN
INTRAVENOUS | Status: AC
  Filled 2019-09-11: qty 2

## 2019-09-11 MED ORDER — OXYCODONE HCL 5 MG/5ML PO SOLN: 5.0000 mg | Freq: Once | ORAL | Status: DC | PRN

## 2019-09-11 MED ORDER — SODIUM CHLORIDE 0.9 % IV SOLN
2.0000 g | Freq: Once | INTRAVENOUS | Status: AC
  Administered 2019-09-11: 2 g via INTRAVENOUS

## 2019-09-11 MED ORDER — LIDOCAINE 2% (20 MG/ML) 5 ML SYRINGE
INTRAMUSCULAR | Status: DC | PRN
  Administered 2019-09-11: 50 mg via INTRAVENOUS

## 2019-09-11 MED ORDER — ONDANSETRON HCL 4 MG/2ML IJ SOLN
INTRAMUSCULAR | Status: AC
  Filled 2019-09-11: qty 2

## 2019-09-11 MED ORDER — DEXAMETHASONE SODIUM PHOSPHATE 10 MG/ML IJ SOLN
INTRAMUSCULAR | Status: AC
  Filled 2019-09-11: qty 1

## 2019-09-11 MED ORDER — PHENYLEPHRINE HCL (PRESSORS) 10 MG/ML IV SOLN
INTRAVENOUS | Status: AC
  Filled 2019-09-11: qty 1

## 2019-09-11 MED ORDER — SUGAMMADEX SODIUM 200 MG/2ML IV SOLN
INTRAVENOUS | Status: DC | PRN
  Administered 2019-09-11: 100 mg via INTRAVENOUS

## 2019-09-11 MED ORDER — METOPROLOL TARTRATE 5 MG/5ML IV SOLN
INTRAVENOUS | Status: AC
  Filled 2019-09-11: qty 5

## 2019-09-11 MED ORDER — FENTANYL CITRATE (PF) 100 MCG/2ML IJ SOLN: 25.0000 ug | INTRAMUSCULAR | Status: DC | PRN

## 2019-09-11 MED ORDER — BUPIVACAINE LIPOSOME 1.3 % IJ SUSP
20.0000 mL | Freq: Once | INTRAMUSCULAR | Status: DC
  Filled 2019-09-11: qty 20

## 2019-09-11 MED ORDER — TRAMADOL HCL 50 MG PO TABS
100.0000 mg | ORAL_TABLET | Freq: Two times a day (BID) | ORAL | Status: DC | PRN
  Administered 2019-09-12: 100 mg via ORAL
  Filled 2019-09-11: qty 2

## 2019-09-11 MED ORDER — DEXAMETHASONE SODIUM PHOSPHATE 10 MG/ML IJ SOLN
INTRAMUSCULAR | Status: DC | PRN
  Administered 2019-09-11: 5 mg via INTRAVENOUS

## 2019-09-11 MED ORDER — LIDOCAINE 2% (20 MG/ML) 5 ML SYRINGE
INTRAMUSCULAR | Status: AC
  Filled 2019-09-11: qty 5

## 2019-09-11 MED ORDER — PHENYLEPHRINE 40 MCG/ML (10ML) SYRINGE FOR IV PUSH (FOR BLOOD PRESSURE SUPPORT)
PREFILLED_SYRINGE | INTRAVENOUS | Status: DC | PRN
  Administered 2019-09-11: 80 ug via INTRAVENOUS
  Administered 2019-09-11 (×3): 40 ug via INTRAVENOUS

## 2019-09-11 MED ORDER — PHENYLEPHRINE HCL-NACL 10-0.9 MG/250ML-% IV SOLN: INTRAVENOUS | Status: DC | PRN

## 2019-09-11 MED ORDER — ENALAPRILAT 1.25 MG/ML IV SOLN
0.6250 mg | Freq: Four times a day (QID) | INTRAVENOUS | Status: DC
  Filled 2019-09-11: qty 0.5

## 2019-09-11 MED ORDER — MORPHINE SULFATE (PF) 2 MG/ML IV SOLN: 1.0000 mg | INTRAVENOUS | Status: DC | PRN

## 2019-09-11 MED ORDER — HYDRALAZINE HCL 20 MG/ML IJ SOLN
10.0000 mg | Freq: Four times a day (QID) | INTRAMUSCULAR | Status: DC | PRN
  Administered 2019-09-11 – 2019-09-12 (×2): 10 mg via INTRAVENOUS
  Filled 2019-09-11 (×2): qty 1

## 2019-09-11 MED ORDER — LACTATED RINGERS IV SOLN: INTRAVENOUS | Status: DC

## 2019-09-11 MED ORDER — CEFAZOLIN SODIUM-DEXTROSE 2-4 GM/100ML-% IV SOLN: 2.0000 g | Freq: Once | INTRAVENOUS | Status: DC

## 2019-09-11 MED ORDER — LABETALOL HCL 5 MG/ML IV SOLN
INTRAVENOUS | Status: AC
  Filled 2019-09-11: qty 4

## 2019-09-11 MED ORDER — PROPOFOL 10 MG/ML IV BOLUS
INTRAVENOUS | Status: DC | PRN
  Administered 2019-09-11: 100 mg via INTRAVENOUS
  Administered 2019-09-11: 50 mg via INTRAVENOUS

## 2019-09-11 MED ORDER — BUPIVACAINE-EPINEPHRINE 0.25% -1:200000 IJ SOLN
INTRAMUSCULAR | Status: DC | PRN
  Administered 2019-09-11: 30 mL

## 2019-09-11 MED ORDER — ONDANSETRON HCL 4 MG/2ML IJ SOLN: 4.0000 mg | Freq: Once | INTRAMUSCULAR | Status: DC | PRN

## 2019-09-11 MED ORDER — SUCCINYLCHOLINE CHLORIDE 200 MG/10ML IV SOSY
PREFILLED_SYRINGE | INTRAVENOUS | Status: DC | PRN
  Administered 2019-09-11: 60 mg via INTRAVENOUS

## 2019-09-11 MED ORDER — POTASSIUM CHLORIDE IN NACL 20-0.45 MEQ/L-% IV SOLN
INTRAVENOUS | Status: DC
  Filled 2019-09-11 (×5): qty 1000

## 2019-09-11 MED ORDER — LABETALOL HCL 5 MG/ML IV SOLN
INTRAVENOUS | Status: DC | PRN
Start: 2019-09-11 — End: 2019-09-11
  Administered 2019-09-11 (×3): 2.5 mg via INTRAVENOUS

## 2019-09-11 MED ORDER — BUPIVACAINE-EPINEPHRINE 0.25% -1:200000 IJ SOLN
INTRAMUSCULAR | Status: AC
  Filled 2019-09-11: qty 1

## 2019-09-11 MED ORDER — PROPOFOL 10 MG/ML IV BOLUS
INTRAVENOUS | Status: AC
  Filled 2019-09-11: qty 20

## 2019-09-11 MED ORDER — LACTATED RINGERS IV SOLN: INTRAVENOUS | Status: DC | PRN

## 2019-09-11 MED ORDER — ROCURONIUM BROMIDE 100 MG/10ML IV SOLN
INTRAVENOUS | Status: DC | PRN
  Administered 2019-09-11: 10 mg via INTRAVENOUS
  Administered 2019-09-11: 40 mg via INTRAVENOUS

## 2019-09-11 MED ORDER — OXYCODONE HCL 5 MG PO TABS: 5.0000 mg | ORAL_TABLET | Freq: Once | ORAL | Status: DC | PRN

## 2019-09-11 MED ORDER — LABETALOL HCL 5 MG/ML IV SOLN: 10.0000 mg | INTRAVENOUS | Status: DC | PRN

## 2019-09-11 MED ORDER — LABETALOL HCL 5 MG/ML IV SOLN
10.0000 mg | INTRAVENOUS | Status: DC | PRN
  Filled 2019-09-11: qty 4

## 2019-09-11 SURGICAL SUPPLY — 26 items
BENZOIN TINCTURE PRP APPL 2/3 (GAUZE/BANDAGES/DRESSINGS) IMPLANT
CABLE HIGH FREQUENCY MONO STRZ (ELECTRODE) IMPLANT
CHLORAPREP W/TINT 26 (MISCELLANEOUS) ×2 IMPLANT
COVER SURGICAL LIGHT HANDLE (MISCELLANEOUS) ×2 IMPLANT
COVER WAND RF STERILE (DRAPES) IMPLANT
DECANTER SPIKE VIAL GLASS SM (MISCELLANEOUS) IMPLANT
DERMABOND ADVANCED (GAUZE/BANDAGES/DRESSINGS) ×1
DERMABOND ADVANCED .7 DNX12 (GAUZE/BANDAGES/DRESSINGS) ×1 IMPLANT
ELECT REM PT RETURN 15FT ADLT (MISCELLANEOUS) ×2 IMPLANT
GLOVE SURG SYN 7.5  E (GLOVE) ×1
GLOVE SURG SYN 7.5 E (GLOVE) ×1 IMPLANT
GOWN STRL REUS W/TWL XL LVL3 (GOWN DISPOSABLE) ×6 IMPLANT
IRRIG SUCT STRYKERFLOW 2 WTIP (MISCELLANEOUS)
IRRIGATION SUCT STRKRFLW 2 WTP (MISCELLANEOUS) IMPLANT
KIT BASIN OR (CUSTOM PROCEDURE TRAY) ×2 IMPLANT
KIT TURNOVER KIT A (KITS) ×2 IMPLANT
SHEARS HARMONIC ACE PLUS 36CM (ENDOMECHANICALS) IMPLANT
SLEEVE XCEL OPT CAN 5 100 (ENDOMECHANICALS) ×2 IMPLANT
STRIP CLOSURE SKIN 1/2X4 (GAUZE/BANDAGES/DRESSINGS) IMPLANT
SUT MNCRL AB 4-0 PS2 18 (SUTURE) ×2 IMPLANT
TOWEL OR 17X26 10 PK STRL BLUE (TOWEL DISPOSABLE) ×2 IMPLANT
TOWEL OR NON WOVEN STRL DISP B (DISPOSABLE) ×2 IMPLANT
TRAY LAPAROSCOPIC (CUSTOM PROCEDURE TRAY) ×2 IMPLANT
TROCAR BLADELESS OPT 5 100 (ENDOMECHANICALS) ×2 IMPLANT
TROCAR XCEL BLUNT TIP 100MML (ENDOMECHANICALS) ×2 IMPLANT
TROCAR XCEL NON-BLD 11X100MML (ENDOMECHANICALS) ×2 IMPLANT

## 2019-09-11 NOTE — Progress Notes (Signed)
PROGRESS NOTE    Bonnie Briggs  UUV:[id number] DOB: 10/07/1934 DOA: 09/05/2019 PCP: Patient, No Pcp Per   Chef Complaints: nausea vomiting and abd pain x 24 hrs  Brief Narrative: 85 y.o. female with medical history significant of previous small bowel obstruction in 2015 status post exploratory laparotomy, diabetes, hypertension who is [place] from [place]  brought in today secondary to abdominal pain with nausea and vomiting.  This has been going on for the last 24 hours.  The vomitus is clear nonbloody and nonbilious.  She is also has not had any bowel movement.  No gas passage today.  Denied any fever.  Patient took some Tums.  She ate some chicken of from [employer] and thought that was the cause.  She was seen in the ER and initial work-up now showing small bowel obstruction.  This appears to be similar to what she had back in 2015.  No hematemesis no melena no bright red blood per rectum.  Patient being admitted to the hospital was small bowel obstruction probably secondary to previous surgery.  General surgery consulted to follow along..  ED Course: Temperature 98.4 blood pressure 116/88 pulse 96 respiratory rate of 20 oxygen sat 99% on room air.  CBC largely within normal except for hemoglobin 15.3.  Chemistry largely within normal calcium 10.4 CO2 21.  Glucose 162. CT abdomen pelvis shows distal small bowel obstruction presumably due to an adhesion with moderate mesenteric edema.   Pt was admitted.  Unsuccessful NG tube insertion and also had bleeding. 09/07/19: S/p NG tube insertion under anesthesia by surgery. Followed by surgery,remains on NG tube decompression for ongoing obstruction  Subjective: Seen this morning.  Daughter at the bedside. Patient reports may have passed gas but daughter does not feel so, patient has some memory problem issues from stroke. 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  Going for x-ray this morning and subsequently underwent surgery today.  Keep NG tube until tomorrow bowel rest IV fluids, supportive measures pain control.  Await for surgery to advance diet once she has return of bowel function   Hypokalemia-improved.  On 40 M EQ KCl IV fluids cont ivf at 75 ml/hr  Poorly controlled HTN. She is on valsartan, diamox and diltiazem at home- unable to use po meds. BP running on higher side today poorly controlled got worse periop. Increase hydralzine to 10 mg prn for sbp >170, add labetalolo prn, considee vasotec q6h if remains up.Transfer to telemetry.  Dehydration: In the setting of SBO, continue IV fluids with potassium chloride.  NGT bleeding-resolved.  Continue Afrin.  Stable.  Elevated lipase on admission, unclear etiology, resolved on recheck.Pancreas was unremarkable on CT abdomen 09/05/19.  DM/HLD on metformin and lipitor at home: Current medication remains on hold.  Hemoglobin A1c stable 6.5.  Continue sliding scale.  Recent Labs  Lab 09/10/19 1748 09/10/19 2350 09/11/19 0554 09/11/19 1132 09/11/19 1452  GLUCAP 110* 100* 105* 98 100*   Hx of Stroke/memory issues: on asa 325 mg, statins.  Due to nasal bleeding and Bowel obstruction unable to use po meds. Monitor.  Hopefully start aspirin soon once okay with surgery and has return of bowel function.  DVT prophylaxis: enoxaparin (LOVENOX) injection 40 mg Start: 09/06/19 0100 Code Status:FULL Family Communication: plan of care discussed with patient and her daughter at bedside.  Status is: Inpatient Remains inpatient  appropriate because:IV treatments appropriate due to intensity of illness or inability to take PO and Inpatient level of care appropriate due to severity of illness for ongoing management of small bowel obstruction.  Discussed with Dr. Ezzard Standing post surgery.  Dispo:The patient is from: Home from  [facility]            Anticipated d/c is to: Home            Anticipated d/c date is:2-3 days            Patient currently is not medically stable to d/c.   Nutrition: Diet Order            Diet NPO time specified  Diet effective now                  Body mass index is 21.26 kg/m.  Consultants:see note  Procedures:see note Microbiology:see note  Medications: Scheduled Meds: . bupivacaine liposome  20 mL Infiltration Once  . enalaprilat  0.625 mg Intravenous Q6H  . [MAR Hold] enoxaparin (LOVENOX) injection  40 mg Subcutaneous QHS  . [MAR Hold] insulin aspart  0-9 Units Subcutaneous Q6H  . [MAR Hold] oxymetazoline  1 spray Each Nare BID  . [MAR Hold] sodium chloride flush  10-40 mL Intracatheter Q12H  . [MAR Hold] sodium chloride flush  3 mL Intravenous Once   Continuous Infusions: . 0.9 % NaCl with KCl 40 mEq / L 100 mL/hr at 09/11/19 0459  . lactated ringers 20 mL/hr at 09/11/19 1255    Antimicrobials: Anti-infectives (From admission, onward)   Start     Dose/Rate Route Frequency Ordered Stop   09/11/19 1230  ceFAZolin (ANCEF) IVPB 2g/100 mL premix  Status:  Discontinued        2 g 200 mL/hr over 30 Minutes Intravenous  Once 09/11/19 1221 09/11/19 1221   09/11/19 1230  cefoTEtan (CEFOTAN) 2 g in sodium chloride 0.9 % 100 mL IVPB        2 g 200 mL/hr over 30 Minutes Intravenous  Once 09/11/19 1222 09/11/19 1336   09/11/19 1216  sodium chloride 0.9 % with cefoTEtan (CEFOTAN) ADS Med       Note to Pharmacy: Vevelyn Royals  : cabinet override      09/11/19 1216 09/11/19 1335      Objective: Vitals: Today's Vitals   09/11/19 1124 09/11/19 1429 09/11/19 1430 09/11/19 1445  BP: (!) 198/105 (!) 177/95 (!) 186/98 (!) 185/91  Pulse: (!) 104 89 92 89  Resp: 16 20 20 17   Temp: 98.7 F (37.1 C) 99.8 F (37.7 C)    TempSrc: Oral     SpO2: 94% 95% 94% 94%  Weight:      Height:      PainSc:  0-No pain  0-No pain    Intake/Output Summary (Last 24 hours) at 09/11/2019  1501 Last data filed at 09/11/2019 1431 Gross per 24 hour  Intake 3311.36 ml  Output 2155 ml  Net 1156.36 ml   Filed Weights   09/05/19 1519  Weight: 54.4 kg   Weight change:    Intake/Output from previous day: 07/19 0701 - 07/20 0700 In: 3102.5 [I.V.:2509.9; IV Piggyback:592.5] Out: 1400 [Urine:700; Emesis/NG output:700] Intake/Output this shift: Total I/O In: 1581.7 [I.V.:1481.7; IV Piggyback:100] Out: 755 [Urine:550; Emesis/NG output:200; Blood:5]  Examination: General exam: AAO, mildly forgetful, NAD, weak appearing. HEENT:Oral mucosa moist, Ear/Nose WNL grossly, dentition normal. Respiratory system: bilaterally clear,no wheezing or crackles,no use of accessory muscle Cardiovascular system: S1 & S2 +,  No JVD,. Gastrointestinal system: Abdomen soft, mildly distended, mildly tender,BS+ Nervous System:Alert, awake, moving extremities and grossly nonfocal Extremities: No edema, distal peripheral pulses palpable.  Skin: No rashes,no icterus. MSK: Normal muscle bulk,tone, power.  Data Reviewed: I have personally reviewed following labs and imaging studies CBC: Recent Labs  Lab 09/07/19 0446 09/08/19 0550 09/09/19 0611 09/10/19 0548 09/11/19 0525  WBC 4.3 4.8 5.5 4.9 6.3  HGB 13.4 13.0 13.7 13.3 14.4  HCT 41.3 40.4 43.0 42.7 46.9*  MCV 94.5 94.4 96.6 97.3 98.9  PLT 130* 119* 119* 110* 118*   Basic Metabolic Panel: Recent Labs  Lab 09/07/19 0446 09/08/19 0550 09/09/19 0611 09/10/19 0548 09/11/19 0525  NA 141 142 145 146* 146*  K 3.6 3.3* 3.5 2.9* 4.4  CL 106 104 104 108 109  CO2 23 27 27 29 28   GLUCOSE 136* 104* 99 172* 106*  BUN 18 20 26* 17 10  CREATININE 0.70 0.68 0.82 0.55 0.48  CALCIUM 9.0 8.7* 8.8* 8.2* 8.5*   GFR: Estimated Creatinine Clearance: 44.8 mL/min (by C-G formula based on SCr of 0.48 mg/dL). Liver Function Tests: Recent Labs  Lab 09/05/19 1600 09/06/19 0226 09/09/19 0611 09/10/19 0548  AST 17 16 34 27  ALT 15 13 15 14   ALKPHOS 70  59 46 41  BILITOT 0.7 0.6 1.1 0.7  PROT 8.0 6.5 6.4* 5.9*  ALBUMIN 4.4 3.6 3.4* 3.1*   Recent Labs  Lab 09/05/19 1600 09/10/19 0548  LIPASE 94* 38   No results for input(s): AMMONIA in the last 168 hours. Coagulation Profile: No results for input(s): INR, PROTIME in the last 168 hours. Cardiac Enzymes: No results for input(s): CKTOTAL, CKMB, CKMBINDEX, TROPONINI in the last 168 hours. BNP (last 3 results) No results for input(s): PROBNP in the last 8760 hours. HbA1C: No results for input(s): HGBA1C in the last 72 hours. CBG: Recent Labs  Lab 09/10/19 1748 09/10/19 2350 09/11/19 0554 09/11/19 1132 09/11/19 1452  GLUCAP 110* 100* 105* 98 100*   Lipid Profile: No results for input(s): CHOL, HDL, LDLCALC, TRIG, CHOLHDL, LDLDIRECT in the last 72 hours. Thyroid Function Tests: No results for input(s): TSH, T4TOTAL, FREET4, T3FREE, THYROIDAB in the last 72 hours. Anemia Panel: No results for input(s): VITAMINB12, FOLATE, FERRITIN, TIBC, IRON, RETICCTPCT in the last 72 hours. Sepsis Labs: No results for input(s): PROCALCITON, LATICACIDVEN in the last 168 hours.  Recent Results (from the past 240 hour(s))  SARS Coronavirus 2 by RT PCR (hospital order, performed in [facility] hospital lab) Nasopharyngeal Nasopharyngeal Swab     Status: None   Collection Time: 09/05/19  9:01 PM   Specimen: Nasopharyngeal Swab  Result Value Ref Range Status   SARS Coronavirus 2 NEGATIVE NEGATIVE Final    Comment: (NOTE) SARS-CoV-2 target nucleic acids are NOT DETECTED.  The SARS-CoV-2 RNA is generally detectable in upper and lower respiratory specimens during the acute phase of infection. The lowest concentration of SARS-CoV-2 viral copies this assay can detect is 250 copies / mL. A negative result does not preclude SARS-CoV-2 infection and should not be used as the sole basis for treatment or other patient management decisions.  A negative result may occur with improper specimen collection  / handling, submission of specimen other than nasopharyngeal swab, presence of viral mutation(s) within the areas targeted by this assay, and inadequate number of viral copies (<250 copies / mL). A negative result must be combined with clinical observations, patient history, and epidemiological information.  Fact Sheet for Patients:  BoilerBrush.com.cy  Fact Sheet for Healthcare Providers: https://pope.com/  This test is not yet approved or  cleared by the [place] FDA and has been authorized for detection and/or diagnosis of SARS-CoV-2 by FDA under an Emergency Use Authorization (EUA).  This EUA will remain in effect (meaning this test can be used) for the duration of the COVID-19 declaration under Section 564(b)(1) of the Act, 21 U.S.C. section 360bbb-3(b)(1), unless the authorization is terminated or revoked sooner.  Performed at [facility], [address] [place], [place] [address]   Surgical pcr screen     Status: None   Collection Time: 09/11/19 10:25 AM   Specimen: Nasal Mucosa; Nasal Swab  Result Value Ref Range Status   MRSA, PCR NEGATIVE NEGATIVE Final   Staphylococcus aureus NEGATIVE NEGATIVE Final    Comment: (NOTE) The Xpert SA Assay (FDA approved for NASAL specimens in patients 12 years of age and older), is one component of a comprehensive surveillance program. It is not intended to diagnose infection nor to guide or monitor treatment. Performed at [facility], [address] [place], [place] [address]       Radiology Studies: DG Abd 1 View  Result Date: 09/10/2019 CLINICAL DATA:  Small-bowel obstruction. EXAM: ABDOMEN - 1 VIEW COMPARISON:  09/09/2019 FINDINGS: The NG tube tip is in the left upper quadrant. Persistent moderately dilated small bowel loops throughout the abdomen with decompression of the colon. No obvious free air. IMPRESSION: Persistent small-bowel  obstruction bowel gas pattern. Electronically Signed   By: Rudie Meyer M.D.   On: 09/10/2019 05:35   DG Abd 2 Views  Result Date: 09/11/2019 CLINICAL DATA:  Right lower abdominal pain EXAM: ABDOMEN - 2 VIEW COMPARISON:  09/10/2019 FINDINGS: As before, there are multiple air-filled dilated loops of small bowel with air-fluid levels. Degree of dilatation is similar. There is no free air. Enteric tube passes into the stomach. Colon appears decompressed. IMPRESSION: Similar appearance of small-bowel obstruction. Electronically Signed   By: Guadlupe Spanish M.D.   On: 09/11/2019 10:04     LOS: 6 days   Lanae Boast, MD [facility]  09/11/2019, 3:01 PM

## 2019-09-11 NOTE — Transfer of Care (Signed)
Immediate Anesthesia Transfer of Care Note  Patient: Bonnie Briggs  Procedure(s) Performed: DIAGNOSTIC LAPAROSCOPY, lysis of adhesions for small bowel obstruction (N/A Abdomen)  Patient Location: PACU  Anesthesia Type:General  Level of Consciousness: drowsy  Airway & Oxygen Therapy: Patient Spontanous Breathing and Patient connected to face mask oxygen  Post-op Assessment: Report given to RN and Post -op Vital signs reviewed and stable  Post vital signs: Reviewed and stable  Last Vitals:  Vitals Value Taken Time  BP 186/98 09/11/19 1430  Temp    Pulse 86 09/11/19 1431  Resp 20 09/11/19 1431  SpO2 94 % 09/11/19 1431  Vitals shown include unvalidated device data.  Last Pain:  Vitals:   09/11/19 1124  TempSrc: Oral  PainSc:       Patients Stated Pain Goal: 2 (09/10/19 0606)  Complications: No complications documented.

## 2019-09-11 NOTE — Anesthesia Procedure Notes (Signed)
Procedure Name: Intubation Date/Time: 09/11/2019 1:05 PM Performed by: Marny Lowenstein, CRNA Pre-anesthesia Checklist: Patient identified, Emergency Drugs available, Suction available and Patient being monitored Patient Re-evaluated:Patient Re-evaluated prior to induction Oxygen Delivery Method: Circle system utilized Preoxygenation: Pre-oxygenation with 100% oxygen Induction Type: IV induction and Cricoid Pressure applied Ventilation: Mask ventilation without difficulty Laryngoscope Size: Miller and 2 Grade View: Grade I Tube type: Oral Tube size: 6.5 mm Number of attempts: 1 Airway Equipment and Method: Stylet Placement Confirmation: ETT inserted through vocal cords under direct vision,  positive ETCO2 and breath sounds checked- equal and bilateral Secured at: 19 cm Tube secured with: Tape Dental Injury: Teeth and Oropharynx as per pre-operative assessment

## 2019-09-11 NOTE — Anesthesia Preprocedure Evaluation (Addendum)
Anesthesia Evaluation  Patient identified by MRN, date of birth, ID band Patient awake    Reviewed: Allergy & Precautions, NPO status , Patient's Chart, lab work & pertinent test results  Airway Mallampati: II  TM Distance: >3 FB Neck ROM: Full    Dental  (+) Edentulous Upper, Edentulous Lower   Pulmonary neg pulmonary ROS,    Pulmonary exam normal breath sounds clear to auscultation       Cardiovascular hypertension, Pt. on medications Normal cardiovascular exam Rhythm:Regular Rate:Normal  Has not been taking BP meds d/t bowel obstruction- very hypertensive in preop 212/111, 170/129 Has not received PRN hydralazine ordered since 7/16 despite meeting criteria   Neuro/Psych negative neurological ROS  negative psych ROS   GI/Hepatic Neg liver ROS, Small bowel obstruction, 1 prior exlap 2015   Endo/Other  diabetes, Well Controlled, Type 2, Oral Hypoglycemic AgentsLast a1c 6.5  Renal/GU negative Renal ROS  negative genitourinary   Musculoskeletal negative musculoskeletal ROS (+)   Abdominal Normal abdominal exam  (+)   Peds  Hematology negative hematology ROS (+) hct 46.9, plt 118   Anesthesia Other Findings   Reproductive/Obstetrics negative OB ROS                           Anesthesia Physical Anesthesia Plan  ASA: IV and emergent  Anesthesia Plan: General   Post-op Pain Management:    Induction: Intravenous and Rapid sequence  PONV Risk Score and Plan: 4 or greater and Ondansetron, Dexamethasone and Treatment may vary due to age or medical condition  Airway Management Planned: Oral ETT  Additional Equipment: None  Intra-op Plan:   Post-operative Plan: Extubation in OR  Informed Consent: I have reviewed the patients History and Physical, chart, labs and discussed the procedure including the risks, benefits and alternatives for the proposed anesthesia with the patient or authorized  representative who has indicated his/her understanding and acceptance.     Dental advisory given and Consent reviewed with POA  Plan Discussed with: CRNA  Anesthesia Plan Comments: (Spoke with patient and daughter regarding very elevated Bps, will proceed with surgery as SBO can be life threatening. Will place 2nd IV. D/w patient possibility of blood transfusion and postoperative ventilation. )      Anesthesia Quick Evaluation

## 2019-09-11 NOTE — Op Note (Signed)
09/05/2019 - 09/11/2019  2:07 PM  PATIENT:  Bonnie Briggs, 85 y.o., female, MRN: [id number]  PREOP DIAGNOSIS:  small bowel obstruction  POSTOP DIAGNOSIS:   Small bowel obstruction  PROCEDURE:   Procedure(s):  DIAGNOSTIC LAPAROSCOPY, lysis of adhesions for small bowel obstruction  SURGEON:   Ovidio Kin, M.D.  ASSISTANT:   Bailey Mech, PA  ANESTHESIA:   general  Anesthesiologist: Lannie Fields, DO; Richardson Landry Tera Mater, MD CRNA: Marny Lowenstein, CRNA  General  EBL:  Minimal  ml  BLOOD ADMINISTERED: none  DRAINS: none   LOCAL MEDICATIONS USED:   30 cc of 1/4% marcaine  SPECIMEN:   None  COUNTS CORRECT:  YES  INDICATIONS FOR PROCEDURE:  [name] is a 85 y.o. (DOB: 1934/03/18) AA female whose primary care physician is Patient, No Pcp Per and comes for small bowel obstruction.   The indications and risks of the surgery were explained to the patient.  The risks include, but are not limited to, infection, bleeding, and nerve injury.  PROCEDURE: The patient was taken to room #4 at [facility].  She underwent a general endotracheal anesthesia.  A Foley catheter was placed.  Her left arm was tucked.  Her abdomen is prepped with ChloraPrep and sterilely draped.  A timeout was held and the surgical checklist run.  I made an infraumbilical incision.  I encountered Novafil sutures from her prior laparotomy and excised to the sutures.  I entered the abdominal cavity and at the incision it was free of adhesions.  I placed a 0 Vicryl suture as a pursestring and inserted a 12 mm Optiview trocar into the abdomen.  I placed 2 additional 5 mm trochars: 1 in the left upper quadrant, and a 5 mm trocar in the left lower quadrant.  I carried out an abdominal exploration.  Both lobes of liver were unremarkable.  The gallbladder was unremarkable.  The stomach that I can see was unremarkable.  Most of her small bowel was dilated.  I was able to find the terminal ileum which was  decompressed and worked backwards to an adhesive band which entrapped the small bowel about 2 to 3 feet from the terminal ileum.  This was a single adhesive band.  I cut the band sharply with scissors.  There was no other evidence of intraloop adhesions in the distal one half of the small bowel.  I visualized the impression of the Foley in the bladder.  Her uterus was atrophic and unremarkable.  Her right and left ovaries were atrophic and otherwise unremarkable.  There were no adhesions to her pelvis.  So she had a single adhesive band which caused the obstruction and to be released.  I closed the umbilical port with a 0 Vicryl suture pursestring and placed an additional 0 Vicryl suture.  I injected her port sites with 30 cc of quarter percent Marcaine  The skin at each site was closed with 4-0 Monocryl suture and the skin painted with Dermabond.  The patient tolerated the procedure well and was transferred to the recovery room.  Ovidio Kin, MD, [facility] Surgery Office phone:  [phone]

## 2019-09-11 NOTE — Anesthesia Postprocedure Evaluation (Signed)
Anesthesia Post Note  Patient: Bonnie Briggs  Procedure(s) Performed: DIAGNOSTIC LAPAROSCOPY, lysis of adhesions for small bowel obstruction (N/A Abdomen)     Patient location during evaluation: PACU Anesthesia Type: General Level of consciousness: awake and alert Pain management: pain level controlled Vital Signs Assessment: post-procedure vital signs reviewed and stable Respiratory status: spontaneous breathing, nonlabored ventilation, respiratory function stable and patient connected to nasal cannula oxygen Cardiovascular status: blood pressure returned to baseline and stable Postop Assessment: no apparent nausea or vomiting Anesthetic complications: no   No complications documented.  Last Vitals:  Vitals:   09/11/19 1515 09/11/19 1526  BP: (!) 179/86 (!) 196/94  Pulse: 86 91  Resp: (!) 21 18  Temp: 37.4 C 37.3 C  SpO2: 96% 92%    Last Pain:  Vitals:   09/11/19 1526  TempSrc: Oral  PainSc:                  Trevor Iha

## 2019-09-11 NOTE — Progress Notes (Signed)
[facility] Surgery Office:  [phone] General Surgery Progress Note   LOS: 6 days  POD -  4 Days Post-Op  Assessment and Plan: 1.  SBO  Trouble with NGT insertion - Kinsinger - 09/07/2019  Prior open enterolysis - 08/08/2013 - Daphine Deutscher  No progress with KUB, thought she has passed a small amount of flatus.  She is now 3 days with NGT, but no progress.  I think she will be best served with abdominal exploration.  I discussed this with the patient and her daughter, June Bethany.   Risks include bleeding, infection, bowel injury or resection, continued bowel obstruction and other unforeseen problem.  Will plan abdominal exploration today.  If she has a lot of adhesions, I discussed gastrostomy tube.  2.  HTN 3.  DM 4.  Hypokalemia - corrected 5.  Stroke - 2020  This has left her with some memory problems. 6.  DVT prophylaxis - Lovenox   Principal Problem:   SBO (small bowel obstruction) (HCC) Active Problems:   HTN (hypertension)   S/P exploratory laparotomyJune2015   DM (diabetes mellitus) (HCC)  Subjective:  NGT and suture is bothering her.  Minimal flatus.  She complains of some right sided abdominal pain.  Daughter, June Bethany, in the room with the patient.  She also has a son who lives in [place].  The patient is visiting from [place], [place].  Objective:   Vitals:   09/10/19 2124 09/11/19 0556  BP: (!) 177/100 (!) 199/95  Pulse: (!) 105 97  Resp: 16 16  Temp: 98.2 F (36.8 C) 98.7 F (37.1 C)  SpO2: 95% 96%     Intake/Output from previous day:  07/19 0701 - 07/20 0700 In: 3102.5 [I.V.:2509.9; IV Piggyback:592.5] Out: 1400 [Urine:700; Emesis/NG output:700]  Intake/Output this shift:  Total I/O In: 381.7 [I.V.:381.7] Out: -    Physical Exam:   General: WN older AA F who is alert and oriented.    HEENT: Normal. Pupils equal. .   Lungs: Clear.   Abdomen: Soft with rare high pitched bowel sounds. NGT - 700 cc recorded last 24 hours   Lab  Results:    Recent Labs    09/10/19 0548 09/11/19 0525  WBC 4.9 6.3  HGB 13.3 14.4  HCT 42.7 46.9*  PLT 110* 118*    BMET   Recent Labs    09/10/19 0548 09/11/19 0525  NA 146* 146*  K 2.9* 4.4  CL 108 109  CO2 29 28  GLUCOSE 172* 106*  BUN 17 10  CREATININE 0.55 0.48  CALCIUM 8.2* 8.5*    PT/INR  No results for input(s): LABPROT, INR in the last 72 hours.  ABG  No results for input(s): PHART, HCO3 in the last 72 hours.  Invalid input(s): PCO2, PO2   Studies/Results:  DG Abd 1 View  Result Date: 09/10/2019 CLINICAL DATA:  Small-bowel obstruction. EXAM: ABDOMEN - 1 VIEW COMPARISON:  09/09/2019 FINDINGS: The NG tube tip is in the left upper quadrant. Persistent moderately dilated small bowel loops throughout the abdomen with decompression of the colon. No obvious free air. IMPRESSION: Persistent small-bowel obstruction bowel gas pattern. Electronically Signed   By: Rudie Meyer M.D.   On: 09/10/2019 05:35     Anti-infectives:   Anti-infectives (From admission, onward)   None      Ovidio Kin, MD, [facility] Surgery Office: [phone] 09/11/2019

## 2019-09-12 ENCOUNTER — Encounter (HOSPITAL_COMMUNITY): Payer: Self-pay | Admitting: Surgery

## 2019-09-12 LAB — CBC
HCT: 43.9 % (ref 36.0–46.0)
Hemoglobin: 13.4 g/dL (ref 12.0–15.0)
MCH: 30.2 pg (ref 26.0–34.0)
MCHC: 30.5 g/dL (ref 30.0–36.0)
MCV: 99.1 fL (ref 80.0–100.0)
Platelets: 119 10*3/uL — ABNORMAL LOW (ref 150–400)
RBC: 4.43 MIL/uL (ref 3.87–5.11)
RDW: 13.2 % (ref 11.5–15.5)
WBC: 8.9 10*3/uL (ref 4.0–10.5)
nRBC: 0 % (ref 0.0–0.2)

## 2019-09-12 LAB — GLUCOSE, CAPILLARY
Glucose-Capillary: 122 mg/dL — ABNORMAL HIGH (ref 70–99)
Glucose-Capillary: 124 mg/dL — ABNORMAL HIGH (ref 70–99)
Glucose-Capillary: 87 mg/dL (ref 70–99)
Glucose-Capillary: 97 mg/dL (ref 70–99)

## 2019-09-12 LAB — BASIC METABOLIC PANEL
Anion gap: 10 (ref 5–15)
BUN: 11 mg/dL (ref 8–23)
CO2: 22 mmol/L (ref 22–32)
Calcium: 8 mg/dL — ABNORMAL LOW (ref 8.9–10.3)
Chloride: 109 mmol/L (ref 98–111)
Creatinine, Ser: 0.61 mg/dL (ref 0.44–1.00)
GFR calc Af Amer: >60 mL/min (ref >60)
GFR calc non Af Amer: >60 mL/min (ref >60)
Glucose, Bld: 92 mg/dL (ref 70–99)
Potassium: 4 mmol/L (ref 3.5–5.1)
Sodium: 141 mmol/L (ref 135–145)

## 2019-09-12 NOTE — Progress Notes (Signed)
PROGRESS NOTE    Bonnie Briggs  ZOX:[id number] DOB: [birth year] DOA: 09/05/2019 PCP: Patient, No Pcp Per   Chef Complaints: nausea vomiting and abd pain x 24 hrs  Brief Narrative: 85 y.o. female with medical history significant of previous small bowel obstruction in 2015 status post exploratory laparotomy, diabetes, hypertension who is visiting her children from [place] brought in today secondary to abdominal pain with nausea and vomiting.  This has been going on for the last 24 hours.  The vomitus is clear nonbloody and nonbilious.  She is also has not had any bowel movement.  No gas passage today.  Denied any fever.  Patient took some Tums.  She ate some chicken of from [employer] and thought that was the cause.  She was seen in the ER and initial work-up now showing small bowel obstruction.  This appears to be similar to what she had back in 2015.  No hematemesis no melena no bright red blood per rectum.  Patient being admitted to the hospital was small bowel obstruction probably secondary to previous surgery.  General surgery consulted to follow along..  ED Course: Temperature 98.4 blood pressure 116/88 pulse 96 respiratory rate of 20 oxygen sat 99% on room air.  CBC largely within normal except for hemoglobin 15.3.  Chemistry largely within normal calcium 10.4 CO2 21.  Glucose 162. CT abdomen pelvis shows distal small bowel obstruction presumably due to an adhesion with moderate mesenteric edema.   Pt was admitted.  Unsuccessful NG tube insertion and also had bleeding. 09/07/19: S/p NG tube insertion under anesthesia by surgery. Followed by surgery,remains on NG tube decompression for ongoing obstruction  Subjective: Postop day 1 -Status post laparoscopic lysis of adhesion The patient was seen and examined this morning, pleasant, awake alert oriented, NG tube still in place.  Reporting gas.   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  No BM yet.-IV fluids electrolyte replacement.   Surgery following, ordered KUB in the morning, continue NG tube decompression.  Hypokalemia- -improved 2.9, 4.4, 4.0 today -On IV fluid with added potassium   HTN on valsartan, diamox and diltiazem at home: BP 140s to 180s, borderline control p.o. meds on hold due to #1.  Continue as needed iv hydralazine... We will resume p.o. meds once tolerating  Dehydration: Improving, in the setting of SBO, continue IV fluids with potassium chloride.  NGT bleeding-continue Afrin.  Stable.... Resolved,  Elevated lipase on admission, unclear etiology, resolved on recheck.Pancreas was unremarkable on CT abdomen 09/05/19.  DM/HLD on metformin and lipitor at home: P.o. meds on hold.  Blood sugar stable. On IVF. Cont to monitor. .HbA1c controlled at 6.5 on 09/06/19. Recent Labs  Lab 09/11/19 1132 09/11/19 1452 09/11/19 1746 09/11/19 2344 09/12/19 0521  GLUCAP 98 100* 107* 95 87   Hx of Stroke/memory issues:  on asa 325 mg, statins.  Due to nasal bleeding and Bowel obstruction unable to use po meds. Monitor.    DVT prophylaxis: enoxaparin (LOVENOX) injection 40 mg Start: 09/06/19 0100 Code Status:FULL Family Communication: plan of care discussed with patient, daughter not at bedside this am. Will update today  Status is: Inpatient  Remains inpatient appropriate because:IV treatments appropriate due to intensity of illness or inability to take PO and Inpatient level of care appropriate due to severity of illness for ongoing management of small bowel  obstruction  Dispo:The patient is from: Home from [facility]            Anticipated d/c is to: Home            Anticipated d/c date is: in 1 day.. Likely in a.m. if tolerating p.o. + gas and BM             Patient currently is not medically stable to d/c....  Is remained to have NG tube, n.p.o., on IV fluids Postop day #1  Nutrition: Diet Order            Diet clear liquid Room service appropriate? Yes; Fluid consistency: Thin  Diet effective now                  Body mass index is 21.26 kg/m.  Consultants:see note  Procedures:see note Microbiology:see note  Medications: Scheduled Meds: . enoxaparin (LOVENOX) injection  40 mg Subcutaneous QHS  . insulin aspart  0-9 Units Subcutaneous Q6H  . oxymetazoline  1 spray Each Nare BID  . sodium chloride flush  10-40 mL Intracatheter Q12H  . sodium chloride flush  3 mL Intravenous Once   Continuous Infusions: . 0.45 % NaCl with KCl 20 mEq / L 50 mL/hr at 09/12/19 0175    Antimicrobials: Anti-infectives (From admission, onward)   Start     Dose/Rate Route Frequency Ordered Stop   09/11/19 1230  ceFAZolin (ANCEF) IVPB 2g/100 mL premix  Status:  Discontinued        2 g 200 mL/hr over 30 Minutes Intravenous  Once 09/11/19 1221 09/11/19 1221   09/11/19 1230  cefoTEtan (CEFOTAN) 2 g in sodium chloride 0.9 % 100 mL IVPB        2 g 200 mL/hr over 30 Minutes Intravenous  Once 09/11/19 1222 09/11/19 2019   09/11/19 1216  sodium chloride 0.9 % with cefoTEtan (CEFOTAN) ADS Med       Note to Pharmacy: Vevelyn Royals  : cabinet override      09/11/19 1216 09/11/19 1335      Objective: Vitals: Today's Vitals   09/12/19 0056 09/12/19 0139 09/12/19 0251 09/12/19 0519  BP: (!) 184/101 132/81  (!) 144/92  Pulse: 94 98  100  Resp: (!) 21   19  Temp: 98.5 F (36.9 C)   (!) 97.5 F (36.4 C)  TempSrc: Oral   Oral  SpO2: 97%   97%  Weight:      Height:      PainSc:  6  4      Intake/Output Summary (Last 24 hours) at 09/12/2019  1014 Last data filed at 09/12/2019 0100 Gross per 24 hour  Intake 2611.64 ml  Output 655 ml  Net 1956.64 ml   Filed Weights   09/05/19 1519  Weight: 54.4 kg   Weight change:    Intake/Output from previous day: 07/20 0701 - 07/21 0700 In: 2993.3 [I.V.:2893.3; IV Piggyback:100] Out: 955 [Urine:750; Emesis/NG output:200; Blood:5] Intake/Output this shift: No intake/output data recorded.   Physical Exam  Constitution:  Alert, cooperative, no distress,   HEENT: NG tube in place-normocephalic, PERRL, otherwise with in Normal limits  Chest:Chest symmetric Cardio vascular:  S1/S2, RRR, No murmure, No Rubs or Gallops  pulmonary: Clear to auscultation bilaterally, respirations unlabored, negative wheezes / crackles Abdomen: Soft,  non-tender, non-distended, bowel sounds,no masses, no organomegaly Muscular skeletal: Limited exam - in bed, able to move all 4 extremities, Normal strength,  Neuro: CNII-XII intact. , normal motor and sensation, reflexes intact  Extremities: No pitting edema lower extremities, +2 pulses  Skin: Dry, warm to touch, negative for any Rashes, No open wounds Wounds: No visible chronic pressure ulcer/wounds per nursing documentation Surgical wounds clean negative erythema edema or drainage      Data Reviewed: I have personally reviewed following labs and imaging studies CBC: Recent Labs  Lab 09/08/19 0550 09/09/19 0611 09/10/19 0548 09/11/19 0525 09/12/19 0526  WBC 4.8 5.5 4.9 6.3 8.9  HGB 13.0 13.7 13.3 14.4 13.4  HCT 40.4 43.0 42.7 46.9* 43.9  MCV 94.4 96.6 97.3 98.9 99.1  PLT 119* 119* 110* 118* 119*   Basic Metabolic Panel: Recent Labs  Lab 09/08/19 0550 09/09/19 0611 09/10/19 0548 09/11/19 0525 09/12/19 0526  NA 142 145 146* 146* 141  K 3.3* 3.5 2.9* 4.4 4.0  CL 104 104 108 109 109  CO2 27 27 29 28 22   GLUCOSE 104* 99 172* 106* 92  BUN 20 26* 17 10 11   CREATININE 0.68 0.82 0.55 0.48 0.61  CALCIUM 8.7* 8.8* 8.2* 8.5* 8.0*    GFR: Estimated Creatinine Clearance: 44.8 mL/min (by C-G formula based on SCr of 0.61 mg/dL). Liver Function Tests: Recent Labs  Lab 09/05/19 1600 09/06/19 0226 09/09/19 0611 09/10/19 0548  AST 17 16 34 27  ALT 15 13 15 14   ALKPHOS 70 59 46 41  BILITOT 0.7 0.6 1.1 0.7  PROT 8.0 6.5 6.4* 5.9*  ALBUMIN 4.4 3.6 3.4* 3.1*   Recent Labs  Lab 09/05/19 1600 09/10/19 0548  LIPASE 94* 38   No results for input(s): AMMONIA in the last 168 hours. Coagulation Profile: No results for input(s): INR, PROTIME in the last 168 hours. Cardiac Enzymes: No results for input(s): CKTOTAL, CKMB, CKMBINDEX, TROPONINI in the last 168 hours. BNP (last 3 results) No results for input(s): PROBNP in the last 8760 hours. HbA1C: No results for input(s): HGBA1C in the last 72 hours. CBG: Recent Labs  Lab 09/11/19 1132 09/11/19 1452 09/11/19 1746 09/11/19 2344 09/12/19 0521  GLUCAP 98 100* 107* 95 87   Lipid Profile: No results for input(s): CHOL, HDL, LDLCALC, TRIG, CHOLHDL, LDLDIRECT in the last 72 hours. Thyroid Function Tests: No results for input(s): TSH, T4TOTAL, FREET4, T3FREE, THYROIDAB in the last 72 hours. Anemia Panel: No results for input(s): VITAMINB12, FOLATE, FERRITIN, TIBC, IRON, RETICCTPCT in the last 72 hours. Sepsis Labs: No results for input(s): PROCALCITON, LATICACIDVEN in the last 168 hours.  Recent Results (from the past 240 hour(s))  SARS Coronavirus 2 by RT PCR (hospital order, [facility]) Nasopharyngeal Nasopharyngeal Swab     Status: None   Collection Time: 09/05/19  9:01 PM   Specimen: Nasopharyngeal Swab  Result Value Ref Range Status   SARS Coronavirus 2 NEGATIVE NEGATIVE Final    Comment: (NOTE) SARS-CoV-2 target nucleic acids are NOT DETECTED.  The SARS-CoV-2 RNA is generally detectable in upper and lower respiratory specimens during the acute phase of infection. The lowest concentration of SARS-CoV-2 viral copies this assay  can detect is 250 copies / mL. A negative result does not preclude SARS-CoV-2 infection and should not be used as the sole basis for treatment or other patient management decisions.  A negative result may occur with improper specimen collection / handling, submission of specimen other than nasopharyngeal swab, presence  of viral mutation(s) within the areas targeted by this assay, and inadequate number of viral copies (<250 copies / mL). A negative result must be combined with clinical observations, patient history, and epidemiological information.  Fact Sheet for Patients:   BoilerBrush.com.cy  Fact Sheet for Healthcare Providers: https://pope.com/  This test is not yet approved or  cleared by the [place] FDA and has been authorized for detection and/or diagnosis of SARS-CoV-2 by FDA under an Emergency Use Authorization (EUA).  This EUA will remain in effect (meaning this test can be used) for the duration of the COVID-19 declaration under Section 564(b)(1) of the Act, 21 U.S.C. section 360bbb-3(b)(1), unless the authorization is terminated or revoked sooner.  Performed at [facility], [address] [place], [place] [address]   Surgical pcr screen     Status: None   Collection Time: 09/11/19 10:25 AM   Specimen: Nasal Mucosa; Nasal Swab  Result Value Ref Range Status   MRSA, PCR NEGATIVE NEGATIVE Final   Staphylococcus aureus NEGATIVE NEGATIVE Final    Comment: (NOTE) The Xpert SA Assay (FDA approved for NASAL specimens in patients 33 years of age and older), is one component of a comprehensive surveillance program. It is not intended to diagnose infection nor to guide or monitor treatment. Performed at [facility], [address] [place], [place] [address]       Radiology Studies: DG Abd 2 Views  Result Date: 09/11/2019 CLINICAL DATA:  Right lower abdominal pain EXAM:  ABDOMEN - 2 VIEW COMPARISON:  09/10/2019 FINDINGS: As before, there are multiple air-filled dilated loops of small bowel with air-fluid levels. Degree of dilatation is similar. There is no free air. Enteric tube passes into the stomach. Colon appears decompressed. IMPRESSION: Similar appearance of small-bowel obstruction. Electronically Signed   By: Guadlupe Spanish M.D.   On: 09/11/2019 10:04     LOS: 7 days   Kendell Bane, MD [facility]  09/12/2019, 10:14 AM

## 2019-09-12 NOTE — Care Management Important Message (Signed)
Important Message  Patient Details IM Letter given to Lanier Clam RN Case Manager to present to the Patient Name: Bonnie Briggs MRN: [id number] Date of Birth: August 07, 1934   Medicare Important Message Given:  Yes     [name] 09/12/2019, 10:33 AM

## 2019-09-12 NOTE — Progress Notes (Signed)
Resumed care of patient from [facility]. Pt with no needs at this time. Will monitor patient.

## 2019-09-13 LAB — GLUCOSE, CAPILLARY
Glucose-Capillary: 105 mg/dL — ABNORMAL HIGH (ref 70–99)
Glucose-Capillary: 105 mg/dL — ABNORMAL HIGH (ref 70–99)

## 2019-09-13 MED ORDER — IRBESARTAN 300 MG PO TABS
300.0000 mg | ORAL_TABLET | Freq: Every day | ORAL | Status: DC
  Administered 2019-09-13: 300 mg via ORAL
  Filled 2019-09-13: qty 1

## 2019-09-13 MED ORDER — DILTIAZEM HCL ER COATED BEADS 120 MG PO CP24
360.0000 mg | ORAL_CAPSULE | Freq: Every day | ORAL | Status: DC
  Administered 2019-09-13: 360 mg via ORAL
  Filled 2019-09-13: qty 3

## 2019-09-13 MED ORDER — TRAMADOL HCL 50 MG PO TABS: 50.0000 mg | ORAL_TABLET | Freq: Four times a day (QID) | ORAL | 0 refills | Status: AC | PRN

## 2019-09-13 MED ORDER — ONDANSETRON HCL 4 MG PO TABS: 4.0000 mg | ORAL_TABLET | Freq: Four times a day (QID) | ORAL | 0 refills | Status: AC | PRN

## 2019-09-13 NOTE — Discharge Summary (Signed)
Physician Discharge Summary [facility]    Patient: Bonnie Briggs                   Admit date: 09/05/2019   DOB: Jun 25, 1934             Discharge date:09/13/2019/8:54 AM ITG:[id number]                          PCP: Patient, No Pcp Per  Disposition: HOME   Recommendations for Outpatient Follow-up:   . Follow up: in 1 week  . Follow-up with surgery-continue with your instructions, wound care, diet  Discharge Condition: Stable   Code Status:   Code Status: Full Code  Diet recommendation: Regular healthy diet   Discharge Diagnoses:    Principal Problem:   SBO (small bowel obstruction) (HCC) Active Problems:   HTN (hypertension)   S/P exploratory laparotomyJune2015   DM (diabetes mellitus) (HCC)   History of Present Illness/ Hospital Course Charline Bills Summary:   85 y.o.femalewith medical history significant ofprevious small bowel obstruction in 2015 status post exploratory laparotomy, diabetes, hypertension who is visiting her children from [place] brought in today secondary to abdominal pain with nausea and vomiting. This has been going on for the last 24 hours. The vomitus is clear nonbloody and nonbilious. She is also has not had any bowel movement. No gas passage today. Denied any fever. Patient took some Tums. She ate some chicken of from [employer] and thought that was the cause. She was seen in the ER and initial work-up now showing small bowel obstruction. This appears to be similar to what she had back in 2015. No hematemesis no melena no bright red blood per rectum. Patient being admitted to the hospital was small bowel obstruction probably secondary to previous surgery. General surgery consulted to follow along..  ED Course:Temperature 98.4 blood pressure 116/88 pulse 96 respiratory rate of 20 oxygen sat 99% on room air. CBC largely within normal except for hemoglobin 15.3. Chemistry largely within normal calcium 10.4 CO2 21. Glucose 162.  CT abdomen pelvis shows distal small bowel obstruction presumably due to an adhesion with moderate mesenteric edema.  Pt was admitted.  Unsuccessful NG tube insertion and also had bleeding. 09/07/19: S/p NG tube insertion under anesthesia by surgery. Followed by surgery,remains on NG tube decompression for ongoing obstruction  Subjective: Postop day 2 -Status post laparoscopic lysis of adhesion The patient was seen and examined this morning, pleasant, awake alert oriented,  NG tube was removed, tolerating diet now. 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 till morning of 7/ 21, diet was initiated by surgery, was advanced as tolerated she has been tolerating well, passing gas   xray showing persistent small bowel obstruction but patient reports passing flatus today too.  No BM yet.-IV fluids electrolyte replacement.   Surgery following, ordered KUB in the morning, continue NG tube decompression.  Hypokalemia- -improved 2.9, 4.4, 4.0 -was On IV fluid with added potassium   HTN on valsartan, diamox and diltiazem at home: BP 140s to 180s, borderline control p.o. meds on hold due to #1-Home medication resumed with exception of acetazolamide  Dehydration: Improving, in the setting of SBO, continue IV fluids with potassium chloride.  NGT bleeding-continue  Afrin.  Stable.... Resolved,  Elevated lipase on admission, unclear etiology, resolved on recheck.Pancreas was unremarkable on CT abdomen 09/05/19.  DM/HLD on metformin and lipitor at home: P.o. meds on hold.  Blood sugar stable. On IVF. Cont to monitor. .HbA1c controlled at 6.5 on 09/06/19. Last Labs          Recent Labs  Lab  09/11/19 1132 09/11/19 1452 09/11/19 1746 09/11/19 2344 09/12/19 0521  GLUCAP 98 100* 107* 95 87     Hx of Stroke/memory issues:  on asa 325 mg, statins.  Due to nasal bleeding and Bowel obstruction unable to use po meds. Monitor.    DVT prophylaxis: enoxaparin (LOVENOX) injection 40 mg Start: 09/06/19 0100tus:FULL Family Communication: plan of care discussed with patient, daughter not at bedside  Status is: Inpatient Remains inpatient appropriate because:IV treatments appropriate due to intensity of illness or inability to take PO and Inpatient level of care appropriate due to severity of illness for ongoing management of small bowel obstruction  Dispo:The patient is from: Home from [facility]  Anticipated d/c is to: Home  Postop day #2   Nutritional status:          Discharge Instructions:   Discharge Instructions    Activity as tolerated - No restrictions   Complete by: As directed    Call MD for:  difficulty breathing, headache or visual disturbances   Complete by: As directed    Call MD for:  persistant nausea and vomiting   Complete by: As directed    Call MD for:  redness, tenderness, or signs of infection (pain, swelling, redness, odor or green/yellow discharge around incision site)   Complete by: As directed    Call MD for:  temperature >100.4   Complete by: As directed    Diet - low sodium heart healthy   Complete by: As directed    Continue soft - diet, advance very slowly over next 4-5 days   Discharge instructions   Complete by: As directed    Follow-up instruction from surgery. Follow-up with surgery Continue ambulate as tolerated, with full precautions Advance her diet very slowly...   Discharge wound care:   Complete by: As directed    Per instruction from surgery   Increase activity slowly   Complete by: As directed        Medication List    STOP taking these medications   acetaZOLAMIDE 250 MG tablet Commonly known as:  DIAMOX   promethazine 12.5 MG tablet Commonly known as: PHENERGAN     TAKE these medications   acetaminophen 325 MG tablet Commonly known as: TYLENOL Take 2 tablets (650 mg total) by mouth every 4 (four) hours as needed for mild pain or headache. What changed: how much to take   aspirin 325 MG tablet Take 325 mg by mouth daily.   atorvastatin 40 MG tablet Commonly known as: LIPITOR Take 40 mg by mouth daily.   baclofen 10 MG tablet Commonly known as: LIORESAL Take 10 mg by mouth daily as needed for muscle spasms.   cholecalciferol 25 MCG (1000 UNIT) tablet Commonly known as: VITAMIN D3 Take 1,000 Units by mouth daily.   diltiazem 360 MG 24 hr capsule Commonly known as: TIAZAC Take 360 mg by mouth daily.   dorzolamide-timolol 22.3-6.8 MG/ML ophthalmic solution Commonly known as: COSOPT Place 1 drop into both eyes daily.   metFORMIN 500 MG (MOD) 24 hr tablet Commonly known as: GLUMETZA Take 500 mg by mouth every evening.   ondansetron 4  MG tablet Commonly known as: ZOFRAN Take 1 tablet (4 mg total) by mouth every 6 (six) hours as needed for nausea.   traMADol 50 MG tablet Commonly known as: ULTRAM Take 1 tablet (50 mg total) by mouth every 6 (six) hours as needed for moderate pain.   valsartan 320 MG tablet Commonly known as: DIOVAN Take 320 mg by mouth daily.   Vitamin A 3 MG (10000 UT) Tabs Take 10,000 Units by mouth daily.       Follow-up Information    Ovidio Kin, MD Follow up on 10/04/2019.   Specialty: General Surgery Why: 3:45pm, arrive 30 minutes prior (3:15pm) to your appointment for paperwork and check in process.  please bring photo ID and insurance card  Contact information: [address] [place] [address] [phone]              Allergies  Allergen Reactions  . Codeine Swelling     Procedures /Studies:   DG Abd 1 View  Result Date: 09/10/2019 CLINICAL DATA:  Small-bowel obstruction. EXAM: ABDOMEN - 1 VIEW  COMPARISON:  09/09/2019 FINDINGS: The NG tube tip is in the left upper quadrant. Persistent moderately dilated small bowel loops throughout the abdomen with decompression of the colon. No obvious free air. IMPRESSION: Persistent small-bowel obstruction bowel gas pattern. Electronically Signed   By: Rudie Meyer M.D.   On: 09/10/2019 05:35   DG Abd 1 View  Result Date: 09/09/2019 CLINICAL DATA:  Small bowel obstruction. EXAM: ABDOMEN - 1 VIEW COMPARISON:  September 08, 2019 FINDINGS: Similar in degree high-grade small bowel obstruction with bowel loops dilated to 5 cm. Enteric catheter tip overlies the gastric bubble, however the side hole is at the level of the expected location of the GE junction. IMPRESSION: 1. Similar in degree high-grade small bowel obstruction. 2. Enteric catheter tip overlies the gastric bubble, however the side hole is at the level of the GE junction. Electronically Signed   By: Ted Mcalpine M.D.   On: 09/09/2019 12:58   DG Abd 1 View  Result Date: 09/08/2019 CLINICAL DATA:  8 hour delay for small bowel obstruction. EXAM: ABDOMEN - 1 VIEW COMPARISON:  Radiograph 09/08/2019, CT 09/05/2019 or you obstructive FINDINGS: Few punctate radiodensities throughout the abdomen may reflect diluted/diffused contrast media with clustered loops of air distended small bowel in the mid abdomen appearing similar to prior and compatible with persisting small-bowel obstruction. No convincing evidence of free intraperitoneal air though evaluation limited on portable supine radiography. No other acute soft tissue abnormality. No acute or suspicious osseous abnormality. Stable degenerative changes in the spine hips and pelvis. IMPRESSION: 1. Findings compatible with persisting small bowel obstruction. 2. Largely diffused contrast media. If further follow-up imaging is to be obtained, consider administering additional contrast media. Electronically Signed   By: Kreg Shropshire M.D.   On: 09/08/2019 16:38    CT ABDOMEN PELVIS W CONTRAST  Result Date: 09/05/2019 CLINICAL DATA:  Abdominal pain, vomiting EXAM: CT ABDOMEN AND PELVIS WITH CONTRAST TECHNIQUE: Multidetector CT imaging of the abdomen and pelvis was performed using the standard protocol following bolus administration of intravenous contrast. CONTRAST:  OMNIPAQUE IOHEXOL 300 MG/ML  SOLN COMPARISON:  08/04/2013 FINDINGS: Lower chest: Moderate right pleural effusion is present with mild atelectasis within the lung bases bilaterally, right greater than left. Moderate coronary artery calcification. Global cardiac size within normal limits. Hepatobiliary: Cholelithiasis noted. The gallbladder is distended, however common there is no pericholecystic inflammatory change identified. Liver is unremarkable.  No intra or extrahepatic biliary ductal dilation. Pancreas: Unremarkable Spleen: Unremarkable Adrenals/Urinary Tract: The adrenal glands are unremarkable. Simple cortical cyst noted within the left kidney. Mild asymmetric cortical atrophy of the right kidney. The kidneys are otherwise unremarkable. The bladder is largely decompressed and is unremarkable. Stomach/Bowel: There is a complete distal small bowel obstruction identified with a point of transition noted within the right mid abdomen on axial image # 59/2, coronal image # 48/4, and sagittal image # 66/5. There is no associated mass or fluid collection in this region. The distal small bowel is decompressed. The more proximal small bowel demonstrates fluid distension. All loops of bowel demonstrate normal enhancement. There is no pneumatosis or free intraperitoneal gas. Mild mesenteric edema is noted within the right mid abdomen, however, there is no free intraperitoneal fluid or loculated intra-abdominal fluid collections. The colon is unremarkable. The appendix is not visualized and is likely absent. Vascular/Lymphatic: No pathologic adenopathy within the abdomen and pelvis. The abdominal vasculature  is age-appropriate with moderate aortoiliac atherosclerotic calcification. No aneurysm. Reproductive: Lobulated appearance of the a uterus with coarse calcifications is in keeping with multiple uterine fibroids. The ovaries are unremarkable. No adnexal masses. Other: The rectum is unremarkable. Musculoskeletal: No acute bone abnormality. IMPRESSION: Distal small bowel obstruction, presumably due to an adhesion, with moderate mesenteric edema identified. No evidence of bowel ischemia. No perforation. Aortic Atherosclerosis (ICD10-I70.0). Electronically Signed   By: Helyn NumbersAshesh  Parikh MD   On: 09/05/2019 17:50   DG Abd 2 Views  Result Date: 09/11/2019 CLINICAL DATA:  Right lower abdominal pain EXAM: ABDOMEN - 2 VIEW COMPARISON:  09/10/2019 FINDINGS: As before, there are multiple air-filled dilated loops of small bowel with air-fluid levels. Degree of dilatation is similar. There is no free air. Enteric tube passes into the stomach. Colon appears decompressed. IMPRESSION: Similar appearance of small-bowel obstruction. Electronically Signed   By: Guadlupe SpanishPraneil  Patel M.D.   On: 09/11/2019 10:04   DG Abd Portable 1V-Small Bowel Obstruction Protocol-24 hr delay  Result Date: 09/08/2019 CLINICAL DATA:  85 year old female with small bowel obstruction EXAM: PORTABLE ABDOMEN - 1 VIEW COMPARISON:  09/07/2019 FINDINGS: Gas within stomach small bowel and colon. Persistent dilation of small bowel loops in the mid abdomen with a paucity of colonic and rectal gas. Partially imaged gastric tube within the upper abdomen. No unexpected radiopaque foreign body. No unexpected soft tissue density or calcifications. Unremarkable musculoskeletal structures. IMPRESSION: Persisting small bowel obstruction. Gastric tube partially imaged. Electronically Signed   By: Gilmer MorJaime  Wagner D.O.   On: 09/08/2019 08:41   DG Abd Portable 1V-Small Bowel Protocol-Position Verification  Result Date: 09/07/2019 CLINICAL DATA:  Nasogastric tube placement.  EXAM: PORTABLE ABDOMEN - 1 VIEW COMPARISON:  Radiographs earlier the same date and 09/05/2019. CT 09/05/2019. FINDINGS: 1429 hours. Nasogastric tube tip projects over the mid stomach. There is persistent moderate diffuse small bowel distension with relative decompression of the colon. No supine evidence of free intraperitoneal air. The bones appear unchanged. IMPRESSION: 1. Nasogastric tube tip projects over the mid stomach. 2. Persistent small bowel obstruction. Electronically Signed   By: Carey BullocksWilliam  Veazey M.D.   On: 09/07/2019 14:58   DG Abd Portable 1V  Result Date: 09/07/2019 CLINICAL DATA:  85 year old female with history of small-bowel obstruction. EXAM: PORTABLE ABDOMEN - 1 VIEW COMPARISON:  09/05/2019. FINDINGS: Multiple dilated loops of small bowel are again noted in the central abdomen measuring up to 5.3 cm in diameter, increased compared to the prior examination. There is a paucity of colonic  and rectal gas and stool. No frank pneumoperitoneum noted on this single supine view. IMPRESSION: 1. Findings are compatible with persistent small bowel obstruction, as above. Electronically Signed   By: Trudie Reed M.D.   On: 09/07/2019 07:24   DG Abd Portable 1 View  Result Date: 09/05/2019 CLINICAL DATA:  85 year old female status post NG placement. EXAM: PORTABLE ABDOMEN - 1 VIEW COMPARISON:  Thirty radiograph dated 09/05/2019. FINDINGS: Partially visualized enteric tube extends to the level of the GE junction and fold back on itself with tip in the distal esophagus. Recommend retraction and repositioning. There is distended stomach as well as mildly distended loops of small bowel measuring up to 3.5 cm. IMPRESSION: 1. Enteric tube folded back on itself with tip in the distal esophagus. Recommend retraction and repositioning. 2. Distended stomach and mildly distended small bowel loops measuring up to 3.5 cm. Electronically Signed   By: Elgie Collard M.D.   On: 09/05/2019 21:37   DG Abd Portable  1 View  Result Date: 09/05/2019 CLINICAL DATA:  Enteric catheter placement EXAM: PORTABLE ABDOMEN - 1 VIEW COMPARISON:  08/13/2013, 09/05/2019 FINDINGS: Frontal view of the lower chest and upper abdomen demonstrates an enteric catheter coiled back upon itself, overlying the gastric fundus. Diffuse gaseous distention of the stomach and small bowel again noted lung bases are clear. IMPRESSION: 1. Enteric catheter coiled over the gastric fundus. 2. Persistent small bowel obstruction. Electronically Signed   By: Sharlet Salina M.D.   On: 09/05/2019 20:24   DG Basil Dess Tube Plc W/Fl W/Rad  Result Date: 09/06/2019 CLINICAL DATA:  Small-bowel obstruction. EXAM: NASO G TUBE PLACEMENT WITH FL AND WITH RAD FLUOROSCOPY TIME:  Fluoroscopy Time:  0 Radiation Exposure Index (if provided by the fluoroscopic device): 0 mGy Number of Acquired Spot Images: None COMPARISON:  CT abdomen/pelvis 09/05/2019, abdominal radiograph 09/05/2019. FINDINGS: NG tube placement was attempted. However, the NG tube did not pass beyond the level of the nasal passage despite multiple attempts via the right and left nares. Fluoroscopy was not utilized. IMPRESSION: Unsuccessful NG tube placement. The NG tube did not pass beyond the level of the nasal passage despite multiple attempts via the right and left nares. Electronically Signed   By: Jackey Loge DO   On: 09/06/2019 12:33      Discharge Exam:    Vitals:   09/12/19 0519 09/12/19 1256 09/12/19 2015 09/13/19 0652  BP: (!) 144/92 (!) 144/82 (!) 141/85 (!) 163/82  Pulse: 100 88 94 94  Resp: 19 19 18 16   Temp: (!) 97.5 F (36.4 C) 97.8 F (36.6 C) 97.9 F (36.6 C) 98.5 F (36.9 C)  TempSrc: Oral Oral Oral Oral  SpO2: 97% 98% 100% 97%  Weight:      Height:        General: Pt lying comfortably in bed & appears in no obvious distress. Cardiovascular: S1 & S2 heard, RRR, S1/S2 +. No murmurs, rubs, gallops or clicks. No JVD or pedal edema. Respiratory: Clear to auscultation  without wheezing, rhonchi or crackles. No increased work of breathing. Abdominal:  Non-distended, non-tender & soft. No organomegaly or masses appreciated. Normal bowel sounds heard. CNS: Alert and oriented. No focal deficits. Extremities: no edema, no cyanosis    The results of significant diagnostics from this hospitalization (including imaging, microbiology, ancillary and laboratory) are listed below for reference.      Microbiology:   Recent Results (from the past 240 hour(s))  SARS Coronavirus 2 by RT PCR (hospital order, performed  in [facility] hospital lab) Nasopharyngeal Nasopharyngeal Swab     Status: None   Collection Time: 09/05/19  9:01 PM   Specimen: Nasopharyngeal Swab  Result Value Ref Range Status   SARS Coronavirus 2 NEGATIVE NEGATIVE Final    Comment: (NOTE) SARS-CoV-2 target nucleic acids are NOT DETECTED.  The SARS-CoV-2 RNA is generally detectable in upper and lower respiratory specimens during the acute phase of infection. The lowest concentration of SARS-CoV-2 viral copies this assay can detect is 250 copies / mL. A negative result does not preclude SARS-CoV-2 infection and should not be used as the sole basis for treatment or other patient management decisions.  A negative result may occur with improper specimen collection / handling, submission of specimen other than nasopharyngeal swab, presence of viral mutation(s) within the areas targeted by this assay, and inadequate number of viral copies (<250 copies / mL). A negative result must be combined with clinical observations, patient history, and epidemiological information.  Fact Sheet for Patients:   BoilerBrush.com.cy  Fact Sheet for Healthcare Providers: https://pope.com/  This test is not yet approved or  cleared by the [place] FDA and has been authorized for detection and/or diagnosis of SARS-CoV-2 by FDA under an Emergency Use Authorization  (EUA).  This EUA will remain in effect (meaning this test can be used) for the duration of the COVID-19 declaration under Section 564(b)(1) of the Act, 21 U.S.C. section 360bbb-3(b)(1), unless the authorization is terminated or revoked sooner.  Performed at [facility], [address], [place], [place] [address]   Surgical pcr screen     Status: None   Collection Time: 09/11/19 10:25 AM   Specimen: Nasal Mucosa; Nasal Swab  Result Value Ref Range Status   MRSA, PCR NEGATIVE NEGATIVE Final   Staphylococcus aureus NEGATIVE NEGATIVE Final    Comment: (NOTE) The Xpert SA Assay (FDA approved for NASAL specimens in patients 1 years of age and older), is one component of a comprehensive surveillance program. It is not intended to diagnose infection nor to guide or monitor treatment. Performed at [facility], [address] [place], [place] [address]      Labs:   CBC: Recent Labs  Lab 09/08/19 0550 09/09/19 0611 09/10/19 0548 09/11/19 0525 09/12/19 0526  WBC 4.8 5.5 4.9 6.3 8.9  HGB 13.0 13.7 13.3 14.4 13.4  HCT 40.4 43.0 42.7 46.9* 43.9  MCV 94.4 96.6 97.3 98.9 99.1  PLT 119* 119* 110* 118* 119*   Basic Metabolic Panel: Recent Labs  Lab 09/08/19 0550 09/09/19 0611 09/10/19 0548 09/11/19 0525 09/12/19 0526  NA 142 145 146* 146* 141  K 3.3* 3.5 2.9* 4.4 4.0  CL 104 104 108 109 109  CO2 GLUCOSE 104* 99 172* 106* 92  BUN 20 26* CREATININE 0.68 0.82 0.55 0.48 0.61  CALCIUM 8.7* 8.8* 8.2* 8.5* 8.0*   Liver Function Tests: Recent Labs  Lab 09/09/19 0611 09/10/19 0548  AST 34 27  ALT 15 14  ALKPHOS 46 41  BILITOT 1.1 0.7  PROT 6.4* 5.9*  ALBUMIN 3.4* 3.1*   BNP (last 3 results) No results for input(s): BNP in the last 8760 hours. Cardiac Enzymes: No results for input(s): CKTOTAL, CKMB, CKMBINDEX, TROPONINI in the last 168 hours. CBG: Recent Labs  Lab 09/12/19 0521 09/12/19 1159  09/12/19 1724 09/12/19 2346 09/13/19 0645  GLUCAP 87 124* 122* 97 105*   Hgb A1c No results for input(s):  HGBA1C in the last 72 hours. Lipid Profile No results for input(s): CHOL, HDL, LDLCALC, TRIG, CHOLHDL, LDLDIRECT in the last 72 hours. Thyroid function studies No results for input(s): TSH, T4TOTAL, T3FREE, THYROIDAB in the last 72 hours.  Invalid input(s): FREET3 Anemia work up No results for input(s): VITAMINB12, FOLATE, FERRITIN, TIBC, IRON, RETICCTPCT in the last 72 hours. Urinalysis    Component Value Date/Time   COLORURINE YELLOW 09/05/2019 1525   APPEARANCEUR HAZY (A) 09/05/2019 1525   LABSPEC 1.019 09/05/2019 1525   PHURINE 7.0 09/05/2019 1525   GLUCOSEU NEGATIVE 09/05/2019 1525   HGBUR NEGATIVE 09/05/2019 1525   BILIRUBINUR NEGATIVE 09/05/2019 1525   KETONESUR NEGATIVE 09/05/2019 1525   PROTEINUR 30 (A) 09/05/2019 1525   UROBILINOGEN 0.2 08/04/2013 1043   NITRITE NEGATIVE 09/05/2019 1525   LEUKOCYTESUR MODERATE (A) 09/05/2019 1525         Time coordinating discharge: Over 45 minutes  SIGNED: Kendell Bane, MD, FACP, [facility]. [facility],  Please use [email or web address] to Page If 7PM-7AM, please contact night-coverage [email or web address] 09/13/2019, 8:54 AM

## 2019-09-13 NOTE — Progress Notes (Addendum)
Patient ID: Bonnie Briggs, female   DOB: 1934/04/03, 85 y.o.   MRN: [id number]    2 Days Post-Op  Subjective: Patient looks great this morning.  Tylenol for pain.  + flatus, BMs (diarrhea).  No nausea or vomiting.  Tolerating CLD well.  Ambulating well.  ROS: See above, otherwise other systems negative  Objective: Vital signs in last 24 hours: Temp:  [97.8 F (36.6 C)-98.5 F (36.9 C)] 98.5 F (36.9 C) (07/22 0652) Pulse Rate:  [88-94] 94 (07/22 0652) Resp:  [16-19] 16 (07/22 0652) BP: (141-163)/(82-85) 163/82 (07/22 0652) SpO2:  [97 %-100 %] 97 % (07/22 0652) Last BM Date: 09/12/19  Intake/Output from previous day: 07/21 0701 - 07/22 0700 In: 2172.8 [P.O.:240; I.V.:1932.8] Out: -  Intake/Output this shift: No intake/output data recorded.  PE: Abd: soft, appropriately tender, +BS, ND, incisions c/d/i with dermabond  Lab Results:  Recent Labs    09/11/19 0525 09/12/19 0526  WBC 6.3 8.9  HGB 14.4 13.4  HCT 46.9* 43.9  PLT 118* 119*   BMET Recent Labs    09/11/19 0525 09/12/19 0526  NA 146* 141  K 4.4 4.0  CL 109 109  CO2 28 22  GLUCOSE 106* 92  BUN 10 11  CREATININE 0.48 0.61  CALCIUM 8.5* 8.0*   PT/INR No results for input(s): LABPROT, INR in the last 72 hours. CMP     Component Value Date/Time   NA 141 09/12/2019 0526   K 4.0 09/12/2019 0526   CL 109 09/12/2019 0526   CO2 22 09/12/2019 0526   GLUCOSE 92 09/12/2019 0526   BUN 11 09/12/2019 0526   CREATININE 0.61 09/12/2019 0526   CALCIUM 8.0 (L) 09/12/2019 0526   PROT 5.9 (L) 09/10/2019 0548   ALBUMIN 3.1 (L) 09/10/2019 0548   AST 27 09/10/2019 0548   ALT 14 09/10/2019 0548   ALKPHOS 41 09/10/2019 0548   BILITOT 0.7 09/10/2019 0548   GFRNONAA >60 09/12/2019 0526   GFRAA >60 09/12/2019 0526   Lipase     Component Value Date/Time   LIPASE 38 09/10/2019 0548       Studies/Results: DG Abd 2 Views  Result Date: 09/11/2019 CLINICAL DATA:  Right lower abdominal pain EXAM: ABDOMEN - 2 VIEW  COMPARISON:  09/10/2019 FINDINGS: As before, there are multiple air-filled dilated loops of small bowel with air-fluid levels. Degree of dilatation is similar. There is no free air. Enteric tube passes into the stomach. Colon appears decompressed. IMPRESSION: Similar appearance of small-bowel obstruction. Electronically Signed   By: Guadlupe Spanish M.D.   On: 09/11/2019 10:04    Anti-infectives: Anti-infectives (From admission, onward)   Start     Dose/Rate Route Frequency Ordered Stop   09/11/19 1230  ceFAZolin (ANCEF) IVPB 2g/100 mL premix  Status:  Discontinued        2 g 200 mL/hr over 30 Minutes Intravenous  Once 09/11/19 1221 09/11/19 1221   09/11/19 1230  cefoTEtan (CEFOTAN) 2 g in sodium chloride 0.9 % 100 mL IVPB        2 g 200 mL/hr over 30 Minutes Intravenous  Once 09/11/19 1222 09/11/19 2019   09/11/19 1216  sodium chloride 0.9 % with cefoTEtan (CEFOTAN) ADS Med       Note to Pharmacy: Vevelyn Royals  : cabinet override      09/11/19 1216 09/11/19 1335       Assessment/Plan POD 2, s/p dx lap with LOA for SBO  -doing great  -adv to carb mod diet.  If tolerates is surgically stable for DC home  -mobilizing well  -will send narc to pharmacy just in case patient needs, otherwise isn't using currently.  Discussed using just tylenol if needed  -arranging follow up with the office   -DC instructions provided for the patient.  -D/W primary service  2.  HTN 3.  DM 4.  Stroke - 2020             This has left her with some memory problems.  FEN - carb mod diet/SLIV  VTE - lovenox ID - none   LOS: 8 days    Letha Cape , [facility] Surgery 09/13/2019, 8:20 AM Please see Amion for pager number during day hours 7:00am-4:30pm or 7:00am -11:30am on weekends  Agree with above. She has done well.  Ovidio Kin, MD, [facility] Surgery Office phone:  [phone]

## 2019-09-13 NOTE — Discharge Instructions (Signed)
[facility]  Please arrive at least 30 min before your appointment to complete your check in paperwork.  If you are unable to arrive 30 min prior to your appointment time we may have to cancel or reschedule you. LAPAROSCOPIC SURGERY: POST OP INSTRUCTIONS Always review your discharge instruction sheet given to you by the facility where your surgery was performed. IF YOU HAVE DISABILITY OR FAMILY LEAVE FORMS, YOU MUST BRING THEM TO THE OFFICE FOR PROCESSING.   DO NOT GIVE THEM TO YOUR DOCTOR.  PAIN CONTROL  1. First take acetaminophen (Tylenol) AND/or ibuprofen (Advil) to control your pain after surgery.  Follow directions on package.  Taking acetaminophen (Tylenol) and/or ibuprofen (Advil) regularly after surgery will help to control your pain and lower the amount of prescription pain medication you may need.  You should not take more than 4,000 mg (4 grams) of acetaminophen (Tylenol) in 24 hours.  You should not take ibuprofen (Advil), aleve, motrin, naprosyn or other NSAIDS if you have a history of stomach ulcers or chronic kidney disease.  2. A prescription for pain medication may be given to you upon discharge.  Take your pain medication as prescribed, if you still have uncontrolled pain after taking acetaminophen (Tylenol) or ibuprofen (Advil). 3. Use ice packs to help control pain. 4. If you need a refill on your pain medication, please contact your pharmacy.  They will contact our office to request authorization. Prescriptions will not be filled after 5pm or on week-ends.  HOME MEDICATIONS 5. Take your usually prescribed medications unless otherwise directed.  DIET 6. You should follow a light diet the first few days after arrival home.  Be sure to include lots of fluids daily. Avoid fatty, fried foods.   CONSTIPATION 7. It is common to experience some constipation after surgery and if you are taking pain medication.  Increasing fluid intake and taking a stool  softener (such as Colace) will usually help or prevent this problem from occurring.  A mild laxative (Milk of Magnesia or Miralax) should be taken according to package instructions if there are no bowel movements after 48 hours.  WOUND/INCISION CARE 8. Most patients will experience some swelling and bruising in the area of the incisions.  Ice packs will help.  Swelling and bruising can take several days to resolve.  9. Unless discharge instructions indicate otherwise, follow guidelines below  a. STERI-STRIPS - you may remove your outer bandages 48 hours after surgery, and you may shower at that time.  You have steri-strips (small skin tapes) in place directly over the incision.  These strips should be left on the skin for 7-10 days.   b. DERMABOND/SKIN GLUE - you may shower in 24 hours.  The glue will flake off over the next 2-3 weeks. 10. Any sutures or staples will be removed at the office during your follow-up visit.  ACTIVITIES 11. You may resume regular (light) daily activities beginning the next day--such as daily self-care, walking, climbing stairs--gradually increasing activities as tolerated.  You may have sexual intercourse when it is comfortable.  Refrain from any heavy lifting or straining until approved by your doctor. a. You may drive when you are no longer taking prescription pain medication, you can comfortably wear a seatbelt, and you can safely maneuver your car and apply brakes.  FOLLOW-UP 12. You should see your doctor in the office for a follow-up appointment approximately 2-3 weeks after your surgery.  You should have been given your post-op/follow-up appointment when   your surgery was scheduled.  If you did not receive a post-op/follow-up appointment, make sure that you call for this appointment within a day or two after you arrive home to insure a convenient appointment time.   WHEN TO CALL YOUR DOCTOR: 1. Fever over 101.0 2. Inability to urinate 3. Continued bleeding from  incision. 4. Increased pain, redness, or drainage from the incision. 5. Increasing abdominal pain  The clinic staff is available to answer your questions during regular business hours.  Please don't hesitate to call and ask to speak to one of the nurses for clinical concerns.  If you have a medical emergency, go to the nearest emergency room or call 911.  A surgeon from [facility] is always on call at the hospital. [address], [address], [place], [place]  [address] ? [address] [address], [place], [place]   [address] [phone] ? [phone] ? FAX [phone]  .........   Managing Your Pain After Surgery Without Opioids    Thank you for participating in our program to help patients manage their pain after surgery without opioids. This is part of our effort to provide you with the best care possible, without exposing you or your family to the risk that opioids pose.  What pain can I expect after surgery? You can expect to have some pain after surgery. This is normal. The pain is typically worse the day after surgery, and quickly begins to get better. Many studies have found that many patients are able to manage their pain after surgery with Over-the-Counter (OTC) medications such as Tylenol and Motrin. If you have a condition that does not allow you to take Tylenol or Motrin, notify your surgical team.  How will I manage my pain? The best strategy for controlling your pain after surgery is around the clock pain control with Tylenol (acetaminophen) and Motrin (ibuprofen or Advil). Alternating these medications with each other allows you to maximize your pain control. In addition to Tylenol and Motrin, you can use heating pads or ice packs on your incisions to help reduce your pain.  How will I alternate your regular strength over-the-counter pain medication? You will take a dose of pain medication every three hours. ; Start by taking 650 mg of Tylenol (2 pills of 325  mg) ; 3 hours later take 600 mg of Motrin (3 pills of 200 mg) ; 3 hours after taking the Motrin take 650 mg of Tylenol ; 3 hours after that take 600 mg of Motrin.   - 1 -  See example - if your first dose of Tylenol is at 12:00 PM   12:00 PM Tylenol 650 mg (2 pills of 325 mg)  3:00 PM Motrin 600 mg (3 pills of 200 mg)  6:00 PM Tylenol 650 mg (2 pills of 325 mg)  9:00 PM Motrin 600 mg (3 pills of 200 mg)  Continue alternating every 3 hours   We recommend that you follow this schedule around-the-clock for at least 3 days after surgery, or until you feel that it is no longer needed. Use the table on the last page of this handout to keep track of the medications you are taking. Important: Do not take more than 3000mg of Tylenol or 3200mg of Motrin in a 24-hour period. Do not take ibuprofen/Motrin if you have a history of bleeding stomach ulcers, severe kidney disease, &/or actively taking a blood thinner  What if I still have pain? If you have pain that is not   controlled with the over-the-counter pain medications (Tylenol and Motrin or Advil) you might have what we call "breakthrough" pain. You will receive a prescription for a small amount of an opioid pain medication such as Oxycodone, Tramadol, or Tylenol with Codeine. Use these opioid pills in the first 24 hours after surgery if you have breakthrough pain. Do not take more than 1 pill every 4-6 hours.  If you still have uncontrolled pain after using all opioid pills, don't hesitate to call our staff using the number provided. We will help make sure you are managing your pain in the best way possible, and if necessary, we can provide a prescription for additional pain medication.   Day 1    Time  Name of Medication Number of pills taken  Amount of Acetaminophen  Pain Level   Comments  AM PM       AM PM       AM PM       AM PM       AM PM       AM PM       AM PM       AM PM       Total Daily amount of Acetaminophen Do not  take more than  3,000 mg per day      Day 2    Time  Name of Medication Number of pills taken  Amount of Acetaminophen  Pain Level   Comments  AM PM       AM PM       AM PM       AM PM       AM PM       AM PM       AM PM       AM PM       Total Daily amount of Acetaminophen Do not take more than  3,000 mg per day      Day 3    Time  Name of Medication Number of pills taken  Amount of Acetaminophen  Pain Level   Comments  AM PM       AM PM       AM PM       AM PM          AM PM       AM PM       AM PM       AM PM       Total Daily amount of Acetaminophen Do not take more than  3,000 mg per day      Day 4    Time  Name of Medication Number of pills taken  Amount of Acetaminophen  Pain Level   Comments  AM PM       AM PM       AM PM       AM PM       AM PM       AM PM       AM PM       AM PM       Total Daily amount of Acetaminophen Do not take more than  3,000 mg per day      Day 5    Time  Name of Medication Number of pills taken  Amount of Acetaminophen  Pain Level   Comments  AM PM       AM PM       AM   PM       AM PM       AM PM       AM PM       AM PM       AM PM       Total Daily amount of Acetaminophen Do not take more than  3,000 mg per day       Day 6    Time  Name of Medication Number of pills taken  Amount of Acetaminophen  Pain Level  Comments  AM PM       AM PM       AM PM       AM PM       AM PM       AM PM       AM PM       AM PM       Total Daily amount of Acetaminophen Do not take more than  3,000 mg per day      Day 7    Time  Name of Medication Number of pills taken  Amount of Acetaminophen  Pain Level   Comments  AM PM       AM PM       AM PM       AM PM       AM PM       AM PM       AM PM       AM PM       Total Daily amount of Acetaminophen Do not take more than  3,000 mg per day        For additional information about how and where to safely dispose of unused  opioid medications - https://www.morepowerfulnc.org  Disclaimer: This document contains information and/or instructional materials adapted from [facility] for the typical patient with your condition. It does not replace medical advice from your health care provider because your experience may differ from that of the typical patient. Talk to your health care provider if you have any questions about this document, your condition or your treatment plan. Adapted from [facility]   

## 2021-02-22 DEATH — deceased

## 2022-01-23 IMAGING — DX DG ABDOMEN 1V
1 series · 1 of 1 positions shown · non-contrast
Comparison: 09/09/2019

CLINICAL DATA: Small-bowel obstruction.

EXAM:
ABDOMEN - 1 VIEW

[abdomen kub]
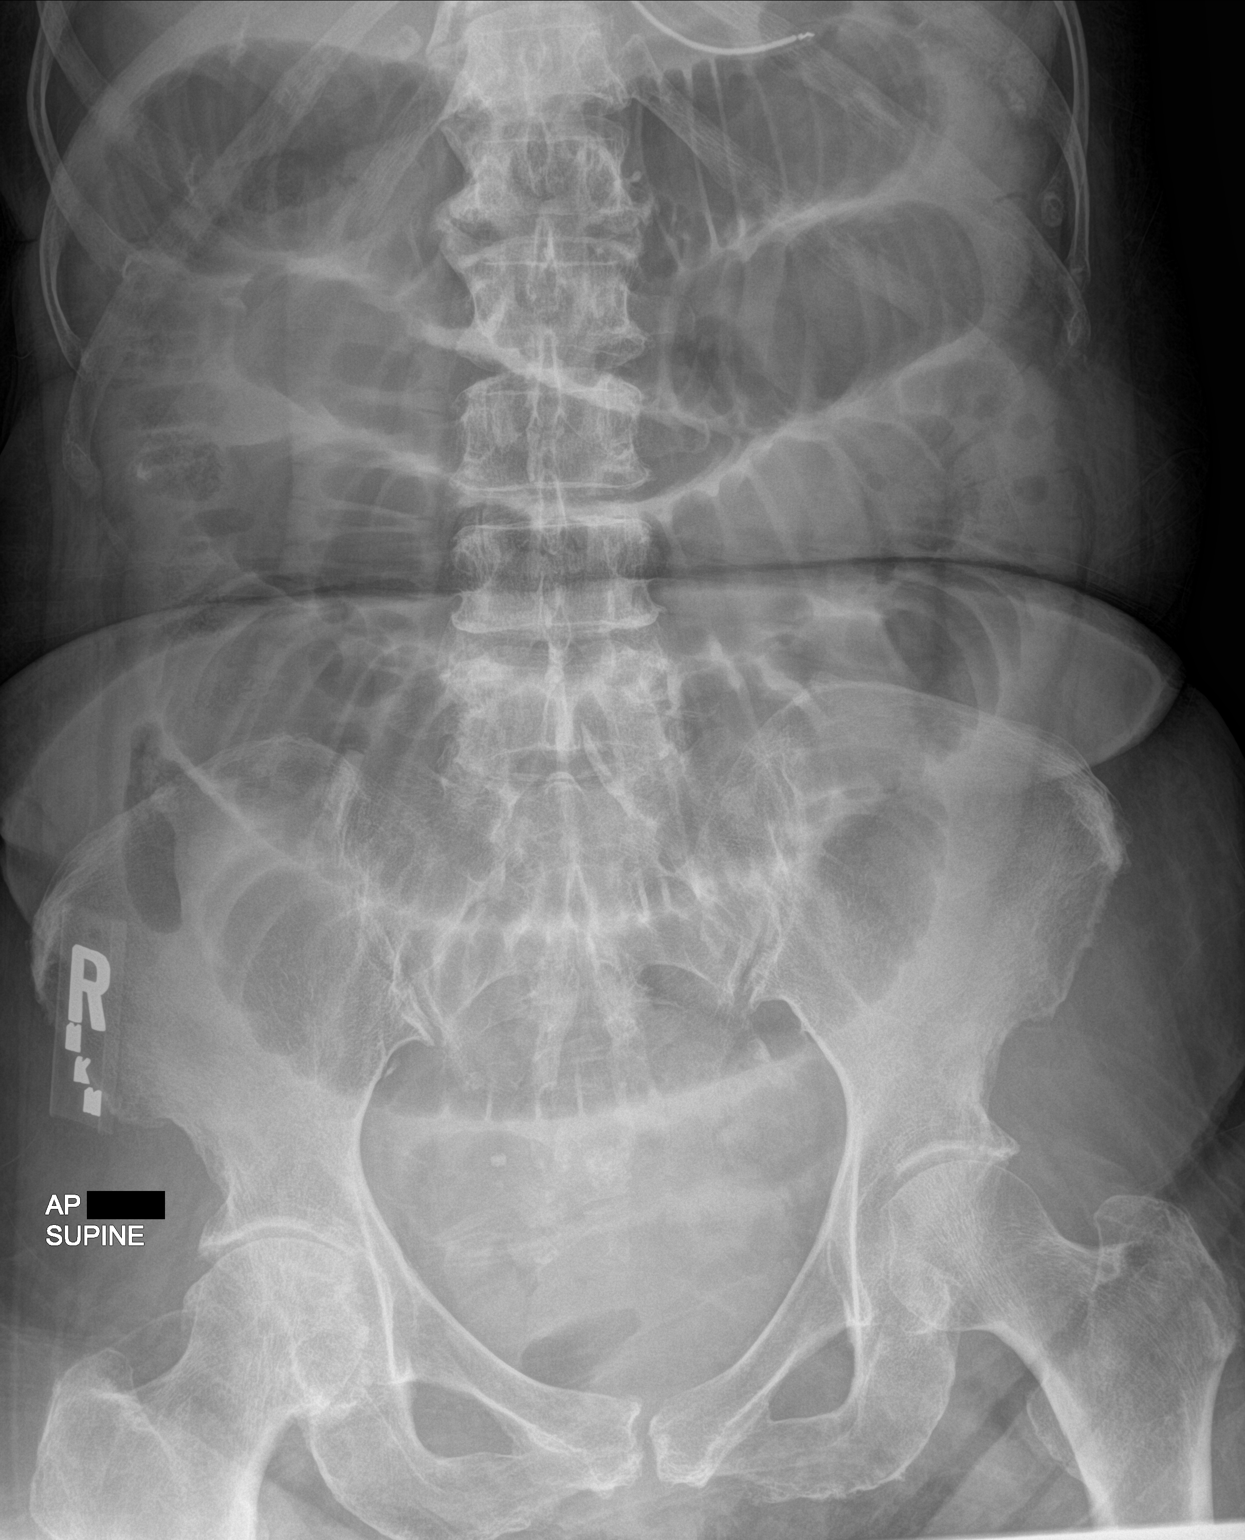

[1 of 1 positions shown; findings below may reference images not displayed]

FINDINGS: The NG tube tip is in the left upper quadrant.

Persistent moderately dilated small bowel loops throughout the
abdomen with decompression of the colon.

No obvious free air.
IMPRESSION: Persistent small-bowel obstruction bowel gas pattern.
